# Patient Record
Sex: Female | Born: 1995 | Race: Black or African American | Hispanic: No | Marital: Single | Smoking: Never smoker
Health system: Southern US, Community
[De-identification: ages and names within clinical notes are randomized; demographics above are authoritative.]

## PROBLEM LIST (undated history)

## (undated) DIAGNOSIS — F419 Anxiety disorder, unspecified: Secondary | ICD-10-CM

## (undated) DIAGNOSIS — R569 Unspecified convulsions: Secondary | ICD-10-CM

## (undated) HISTORY — DX: Anxiety disorder, unspecified: F41.9

## (undated) HISTORY — DX: Unspecified convulsions: R56.9

---
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
## 2013-02-20 ENCOUNTER — Other Ambulatory Visit: Payer: Self-pay | Admitting: *Deleted

## 2013-02-20 MED ORDER — CLONAZEPAM 0.5 MG PO TABS: ORAL_TABLET | ORAL | Status: DC

## 2013-02-20 NOTE — Telephone Encounter (Signed)
[facility] 

## 2013-02-20 NOTE — Telephone Encounter (Signed)
Please route to your nurse first before signing and let know if approved or not so she can call in. Thanks

## 2013-02-20 NOTE — Telephone Encounter (Signed)
PLEASE TAKE CARE OF

## 2013-02-20 NOTE — Telephone Encounter (Signed)
Ok to call in

## 2013-03-26 ENCOUNTER — Other Ambulatory Visit: Payer: Self-pay | Admitting: Nurse Practitioner

## 2013-03-27 NOTE — Telephone Encounter (Signed)
Only been here once, 01/21/13 by ACM. Not on med list

## 2013-04-22 ENCOUNTER — Other Ambulatory Visit: Payer: Self-pay

## 2013-04-22 NOTE — Telephone Encounter (Signed)
rx denied Pt needs to be seen

## 2013-04-22 NOTE — Telephone Encounter (Signed)
Last filled 02/20/13  Last seen 01/21/13    If approved phone in and have nurse notify patient

## 2013-05-07 ENCOUNTER — Encounter: Payer: Self-pay | Admitting: Family Medicine

## 2013-05-07 ENCOUNTER — Ambulatory Visit (INDEPENDENT_AMBULATORY_CARE_PROVIDER_SITE_OTHER): Payer: Medicaid Other | Admitting: Family Medicine

## 2013-05-07 VITALS — BP 107/70 | HR 74 | Temp 97.7°F | Ht 61.25 in | Wt 221.0 lb

## 2013-05-07 DIAGNOSIS — G40909 Epilepsy, unspecified, not intractable, without status epilepticus: Secondary | ICD-10-CM

## 2013-05-07 DIAGNOSIS — R635 Abnormal weight gain: Secondary | ICD-10-CM

## 2013-05-07 DIAGNOSIS — E669 Obesity, unspecified: Secondary | ICD-10-CM

## 2013-05-07 DIAGNOSIS — F411 Generalized anxiety disorder: Secondary | ICD-10-CM

## 2013-05-07 MED ORDER — FLUOXETINE HCL 20 MG PO TABS: 20.0000 mg | ORAL_TABLET | Freq: Every day | ORAL | Status: DC

## 2013-05-07 MED ORDER — LEVETIRACETAM 250 MG PO TABS: ORAL_TABLET | ORAL | Status: DC

## 2013-05-07 MED ORDER — CLONAZEPAM 0.5 MG PO TABS: ORAL_TABLET | ORAL | Status: DC

## 2013-05-07 NOTE — Progress Notes (Signed)
HPI:  Patient presents today for general followup visit. Patient desire medication refills for her seizure disorder and anxiety.   Patient takes Keppra 750 mg at night and 250 mg during the day for seizure disorder. Patient states her last seizure was approximately July 2013. Has been otherwise asymptomatic. Was initially followed with [facility] neurology. However, has been getting her refills here at [facility].  Patient also is requesting a refill for her Klonopin. Patient was a baseline history of anxiety for the past 3-4 years. Patient states Klonopin nightly to help with sleep. Patient denies itch or depressive symptoms although is generally anxious about many things per patient. No homicidal or suicidal ideations. Patient denies any persistent insomnia, no irritability, no pressured speech. No known family history of bipolar disorder. Patient was prescribed Celexa for her anxiety. However, patient did not take this out of concern for seizures. Patient has not taken any SSRIs to help with anxiety in the past.  Patient also recently seen for abdominal pain and [facility]. Patient states she left AGAINST MEDICAL ADVICE when they recommended a CT scan. Abdominal pain is been fairly mild. Predominantly in the right lower quadrant. Intermittent in nature.  There are no active problems to display for this patient.  Past Medical History: Past Medical History  Diagnosis Date  . Seizures   . Anxiety     Past Surgical History: History reviewed. No pertinent past surgical history.  Social History: History   Social History  . Marital Status: Single    Spouse Name: N/A    Number of Children: N/A  . Years of Education: N/A   Social History Main Topics  . Smoking status: Never Smoker   . Smokeless tobacco: Never Used  . Alcohol Use: No  . Drug Use: No  . Sexually Active: None   Other Topics Concern  . None   Social History Narrative  . None     Family History: Family History  Problem Relation Age of Onset  . Hypertension Mother   . Liver disease Father   . Hepatitis C Father   . Stroke Sister 61  . Diabetes Maternal Grandmother   . Heart disease Maternal Grandmother     Allergies: No Known Allergies  Current Outpatient Prescriptions  Medication Sig Dispense Refill  . clonazePAM (KLONOPIN) 0.5 MG tablet Take one tablet by mouth once daily as needed  30 tablet  1  . fluticasone (FLONASE) 50 MCG/ACT nasal spray Place 2 sprays into the nose daily.      Marland Kitchen levETIRAcetam (KEPPRA) 250 MG tablet 3 tabs at night,  1 tab in am  125 tablet  6  . FLUoxetine (PROZAC) 20 MG tablet Take 1 tablet (20 mg total) by mouth daily.  30 tablet  3   No current facility-administered medications for this visit.   Review Of Systems: 12 point ROS negative except as noted above in HPI.   Physical Exam: Filed Vitals:   05/07/13 0948  BP: 107/70  Pulse: 74  Temp: 97.7 F (36.5 C)   General: obese, NAD  HEENT: PERRLA and extra ocular movement intact Heart: S1, S2 normal, no murmur, rub or gallop, regular rate and rhythm Lungs: clear to auscultation, no wheezes or rales and unlabored breathing Abdomen: Obese abdomen, soft, positive bowel sounds, minimal to mild abdominal pain diffusely  Extremities: extremities normal, atraumatic, no cyanosis or edema Skin:no rashes, no ecchymoses Neurology: normal without focal findings  Labs and Imaging: none   Assessment and Plan:  Obesity - Plan: Amb ref to Medical Nutrition Therapy-MNT  Anxiety state, unspecified - Plan: clonazePAM (KLONOPIN) 0.5 MG tablet, FLUoxetine (PROZAC) 20 MG tablet  Seizure disorder - Plan: levETIRAcetam (KEPPRA) 250 MG tablet  Start on Prozac for long-term management of anxiety. Fairly low seizure risk with his medication. Especially at low doses. When necessary, and for breakthrough anxiety. Continue Keppra Patient desires nutrition referral for obesity. This was  ordered. Followup in 36 weeks for reevaluation of mood with Prozac PHQ-9 score 13 today. Moderat disease.   In terms of abdominal pain, patient declined any workup of this today even though we discussed this. Discussed with patient that if her abdominal pain worsens she will need to be seen and imaging will need to be done.

## 2013-05-13 ENCOUNTER — Telehealth: Payer: Self-pay | Admitting: Family Medicine

## 2013-05-13 MED ORDER — LEVETIRACETAM 250 MG PO TABS: 250.0000 mg | ORAL_TABLET | ORAL | Status: DC

## 2013-05-13 MED ORDER — LEVETIRACETAM 750 MG PO TABS: 750.0000 mg | ORAL_TABLET | Freq: Every evening | ORAL | Status: DC

## 2013-05-13 NOTE — Telephone Encounter (Signed)
[facility] [place] [facility]'s request.    She now requests [place].  [facility] [place].  Patient is aware.

## 2013-07-03 ENCOUNTER — Other Ambulatory Visit: Payer: Self-pay | Admitting: Family Medicine

## 2013-07-06 ENCOUNTER — Other Ambulatory Visit: Payer: Self-pay | Admitting: Family Medicine

## 2013-07-08 ENCOUNTER — Other Ambulatory Visit: Payer: Self-pay | Admitting: Family Medicine

## 2013-07-08 DIAGNOSIS — F411 Generalized anxiety disorder: Secondary | ICD-10-CM

## 2013-07-08 MED ORDER — CLONAZEPAM 0.5 MG PO TABS: ORAL_TABLET | ORAL | Status: DC

## 2013-07-08 NOTE — Telephone Encounter (Signed)
Last seen 05/07/13  Dr Alvester Morin  If approved route to nurse to call in

## 2013-07-08 NOTE — Telephone Encounter (Signed)
Please phone clonazepam rx with 1 refill

## 2013-07-09 NOTE — Telephone Encounter (Signed)
CALLED IN 

## 2013-07-22 ENCOUNTER — Other Ambulatory Visit: Payer: Self-pay | Admitting: Family Medicine

## 2013-07-24 ENCOUNTER — Telehealth: Payer: Self-pay | Admitting: Family Medicine

## 2013-07-24 ENCOUNTER — Other Ambulatory Visit: Payer: Self-pay | Admitting: Nurse Practitioner

## 2013-07-24 ENCOUNTER — Other Ambulatory Visit: Payer: Self-pay | Admitting: Family Medicine

## 2013-07-24 DIAGNOSIS — R569 Unspecified convulsions: Secondary | ICD-10-CM

## 2013-07-24 MED ORDER — LEVETIRACETAM 250 MG PO TABS: 250.0000 mg | ORAL_TABLET | ORAL | Status: DC

## 2013-07-24 MED ORDER — LEVETIRACETAM 750 MG PO TABS: 750.0000 mg | ORAL_TABLET | Freq: Every evening | ORAL | Status: DC

## 2013-07-24 NOTE — Telephone Encounter (Signed)
sz meds sent to pharm

## 2013-09-06 ENCOUNTER — Other Ambulatory Visit: Payer: Self-pay | Admitting: Nurse Practitioner

## 2013-09-08 NOTE — Telephone Encounter (Signed)
Last seen by you on 05/07/13, last filled 07/08/13 with 1 RF, if approved route to pool A so [facility] [place] [phone]

## 2013-09-10 ENCOUNTER — Telehealth: Payer: Self-pay | Admitting: Family Medicine

## 2013-09-11 NOTE — Telephone Encounter (Signed)
Patient will schedule an appointment with Dr. Alvester Morin for the end of next week.  Schedule is not in the computer yet so I will call her back next week.

## 2013-09-17 ENCOUNTER — Encounter: Payer: Self-pay | Admitting: Family Medicine

## 2013-09-17 ENCOUNTER — Ambulatory Visit (INDEPENDENT_AMBULATORY_CARE_PROVIDER_SITE_OTHER): Payer: Medicaid Other | Admitting: Family Medicine

## 2013-09-17 ENCOUNTER — Encounter (INDEPENDENT_AMBULATORY_CARE_PROVIDER_SITE_OTHER): Payer: Self-pay

## 2013-09-17 VITALS — BP 116/72 | HR 71 | Temp 99.7°F | Wt 226.4 lb

## 2013-09-17 DIAGNOSIS — R109 Unspecified abdominal pain: Secondary | ICD-10-CM

## 2013-09-17 DIAGNOSIS — F411 Generalized anxiety disorder: Secondary | ICD-10-CM

## 2013-09-17 LAB — POCT URINALYSIS DIPSTICK
Bilirubin, UA: NEGATIVE
Glucose, UA: NEGATIVE
Nitrite, UA: NEGATIVE
Urobilinogen, UA: NEGATIVE

## 2013-09-17 MED ORDER — CLONAZEPAM 0.5 MG PO TABS: ORAL_TABLET | ORAL | Status: DC

## 2013-09-17 MED ORDER — FLUOXETINE HCL 20 MG PO TABS: 20.0000 mg | ORAL_TABLET | Freq: Every day | ORAL | Status: DC

## 2013-09-17 NOTE — Progress Notes (Signed)
  Subjective:    Patient ID: Kara Benson, female    DOB: 30-Aug-1996, 17 y.o.   MRN: [id number]  HPI Patient presents today for mood followup. Patient's was seen back in June for anxiety. Patient was placed on course of Prozac as well as Klonopin for breakthrough symptoms. Patient also with a baseline history of seizure disorder. On Keppra chronically. No seizure activity over many years. Per patient, she has been noncompliant with taking the Prozac. Patient is only taking the Klonopin for anxiety. Patient is concerned she may have become dependent on the medicine to the point she gets questionable withdrawals when not taking the medicine. Patient states her anxiety is very poorly controlled. She's had a difficult time sleeping as well she is overly anxious. Patient states she feels nervous taking the medicine. Has taken SSRIs in the past with no reported seizure activity. No homicidal or suicidal ideations.  Patient also reports right-sided abdominal discomfort. This is been a chronic issue for several months. Patient reports she's actually been to the ER for this and left AGAINST MEDICAL ADVICE when they were attempting a CT scan because she felt uncomfortable about doing the CT scan. No significant abdominal pain. No nausea vomiting. No dysuria. No vaginal discharge. Pain does not wake her up from sleep. Has more of a generalized intermittent abdominal discomfort. Abdominal discomfort does not seem to be related to food intake.   Review of Systems  All other systems reviewed and are negative.       Objective:   Physical Exam  Constitutional:  Obese  HENT:  Head: Normocephalic and atraumatic.  Eyes: Conjunctivae are normal. Pupils are equal, round, and reactive to light.  Neck: Normal range of motion.  Cardiovascular: Normal rate and regular rhythm.   Pulmonary/Chest: Effort normal.  Abdominal: Soft. Bowel sounds are normal.  No discernible abdominal pain on exam even with deep  palpation.  Musculoskeletal: Normal range of motion.  Neurological: She is alert.          Assessment & Plan:  Abdominal discomfort - Plan: US Abdomen Complete, POCT urinalysis dipstick, POCT urine pregnancy  Anxiety state, unspecified - Plan: FLUoxetine (PROZAC) 20 MG tablet, clonazePAM (KLONOPIN) 0.5 MG tablet, DISCONTINUED: clonazePAM (KLONOPIN) 0.5 MG tablet   Anxiety: Had a relatively lengthy discussion with both patient and mom about the importance of SSRI use for chronic anxiety. Discussed with patient and mom that while there is some risk of seizure activity with SSRIs and is relatively low especially low dose SSRI treatment. Discussed with patient and mom that at this point if she feels uncomfortable taking an SSRI but I do not feel comfortable prescribing her any anxiolytic medications and she will likely need a psychiatry referral. I would like to see the patient back in 2 weeks for reevaluation of her mood. Patient states that she is agreeable to this.  Abdominal discomfort: There is no discernible abdominal discomfort on exam. We'll check a urinalysis to rule out UTI. Will send patient for abdominal ultrasound to eval for any hepatobiliary disease. Discussed using a laxative if necessary. Also discussed with patient the importance of compliance with treatments and testing for medical issues, otherwise patient will not be able to fully have her issues evaluated.

## 2013-09-18 ENCOUNTER — Other Ambulatory Visit: Payer: Self-pay | Admitting: Family Medicine

## 2013-09-22 ENCOUNTER — Other Ambulatory Visit (INDEPENDENT_AMBULATORY_CARE_PROVIDER_SITE_OTHER): Payer: Medicaid Other

## 2013-09-22 ENCOUNTER — Other Ambulatory Visit: Payer: Self-pay | Admitting: *Deleted

## 2013-09-22 DIAGNOSIS — N390 Urinary tract infection, site not specified: Secondary | ICD-10-CM

## 2013-09-22 MED ORDER — CEPHALEXIN 500 MG PO CAPS: 500.0000 mg | ORAL_CAPSULE | Freq: Two times a day (BID) | ORAL | Status: DC

## 2013-09-24 LAB — URINE CULTURE

## 2013-09-28 ENCOUNTER — Telehealth: Payer: Self-pay | Admitting: Family Medicine

## 2013-09-28 NOTE — Telephone Encounter (Signed)
NO RESULTS YET. PT SAID SHE WAS GOING TO CALL FACILITY THAT DID U/S AND ASK THEM TO SEND TO [place].

## 2013-09-30 ENCOUNTER — Ambulatory Visit: Payer: Medicaid Other | Admitting: Family Medicine

## 2013-09-30 NOTE — Telephone Encounter (Signed)
I CALLED [facility] AND HAD U/S RESULTS FAXED TO DR. Alvester Morin. DR NEWTON REVIEWED U/S AND RESULTS WERE NEGATIVE. PT NOTIFIED AND VERBALIZED UNDERSTANDING.

## 2013-10-06 ENCOUNTER — Ambulatory Visit (INDEPENDENT_AMBULATORY_CARE_PROVIDER_SITE_OTHER): Payer: Medicaid Other | Admitting: Family Medicine

## 2013-10-06 VITALS — BP 121/76 | HR 90 | Temp 99.9°F | Ht 62.13 in | Wt 226.0 lb

## 2013-10-06 DIAGNOSIS — F411 Generalized anxiety disorder: Secondary | ICD-10-CM

## 2013-10-06 NOTE — Progress Notes (Signed)
  Subjective:    Patient ID: Kara Benson, female    DOB: 1996-02-13, 17 y.o.   MRN: [id number]  HPI  Pt presents today for mood follow up  Pt recently started on prozac for anxiety  Has been compliant with this medication over the past 2 weeks Feels like her anxiety and sleep are much improved with medication  No seizure activity in setting of baseline hx/o epilepsy.  Has had some HA with medication, but feels like this may be improving.     Review of Systems  All other systems reviewed and are negative.       Objective:   Physical Exam  Constitutional:  Obese   HENT:  Head: Normocephalic and atraumatic.  Eyes: Conjunctivae are normal. Pupils are equal, round, and reactive to light.  Neck: Normal range of motion.  Cardiovascular: Normal rate, regular rhythm and normal heart sounds.   Pulmonary/Chest: Effort normal and breath sounds normal.  Abdominal: Soft.  Musculoskeletal: Normal range of motion.  Neurological: She is alert.  Skin: Skin is warm.          Assessment & Plan:  Anxiety state, unspecified  Clinically improved with low dose prozac  Continue with this dose.  Consider change in medication within drug class if HA  Persists vs. Psych referral  Plan for follow up in 2 weeks  Discussed general and neuropsych red flags.  Follow up as needed.

## 2013-10-12 ENCOUNTER — Ambulatory Visit (INDEPENDENT_AMBULATORY_CARE_PROVIDER_SITE_OTHER): Payer: Medicaid Other | Admitting: General Practice

## 2013-10-12 ENCOUNTER — Encounter: Payer: Self-pay | Admitting: General Practice

## 2013-10-12 VITALS — BP 104/61 | HR 100 | Temp 99.7°F | Ht 62.0 in | Wt 223.0 lb

## 2013-10-12 DIAGNOSIS — J302 Other seasonal allergic rhinitis: Secondary | ICD-10-CM

## 2013-10-12 DIAGNOSIS — J329 Chronic sinusitis, unspecified: Secondary | ICD-10-CM

## 2013-10-12 DIAGNOSIS — J309 Allergic rhinitis, unspecified: Secondary | ICD-10-CM

## 2013-10-12 MED ORDER — FLUTICASONE PROPIONATE 50 MCG/ACT NA SUSP: 2.0000 | Freq: Every day | NASAL | Status: DC

## 2013-10-12 MED ORDER — AZITHROMYCIN 250 MG PO TABS: ORAL_TABLET | ORAL | Status: DC

## 2013-10-12 NOTE — Progress Notes (Signed)
  Subjective:    Patient ID: Kara Benson, female    DOB: 19-Jan-1996, 19 y.o.   MRN: [id number]  HPI Patient presents today with complaints of nasal congestion and sore throat. Reports onset 2 days ago and congestion is gradually worsening. Denies OTC medications. Reports first day of last menstrual cycle was 4 days ago.     Review of Systems  Constitutional: Negative for fever and chills.  HENT: Positive for congestion, postnasal drip, sinus pressure and sore throat.   Respiratory: Negative for chest tightness and shortness of breath.   Cardiovascular: Negative for chest pain and palpitations.  Neurological: Negative for dizziness, weakness and headaches.       Objective:   Physical Exam  Constitutional: She is oriented to person, place, and time. She appears well-developed and well-nourished.  HENT:  Head: Normocephalic and atraumatic.  Right Ear: External ear normal.  Left Ear: External ear normal.  Nose: Right sinus exhibits maxillary sinus tenderness and frontal sinus tenderness. Left sinus exhibits maxillary sinus tenderness and frontal sinus tenderness.  Mouth/Throat: Posterior oropharyngeal erythema present.  Eyes: EOM are normal. Pupils are equal, round, and reactive to light.  Neck: Normal range of motion. Neck supple.  Cardiovascular: Normal rate, regular rhythm and normal heart sounds.   No murmur heard. Pulmonary/Chest: Effort normal and breath sounds normal. No respiratory distress. She exhibits no tenderness.  Neurological: She is alert and oriented to person, place, and time.  Skin: Skin is warm and dry. No rash noted.  Psychiatric: She has a normal mood and affect.          Assessment & Plan:  1. Sinusitis  - azithromycin (ZITHROMAX) 250 MG tablet; Take as directed  Dispense: 6 tablet; Refill: 0  2. Seasonal allergic rhinitis  - fluticasone (FLONASE) 50 MCG/ACT nasal spray; Place 2 sprays into both nostrils daily.  Dispense: 16 g; Refill: 2 -increase  fluids -RTO if symptoms worsen or unresolved -Patient verbalized understanding Coralie Keens, FNP-C

## 2013-10-12 NOTE — Patient Instructions (Signed)

## 2013-10-29 ENCOUNTER — Other Ambulatory Visit: Payer: Self-pay | Admitting: *Deleted

## 2013-10-29 DIAGNOSIS — J329 Chronic sinusitis, unspecified: Secondary | ICD-10-CM

## 2013-10-29 NOTE — Telephone Encounter (Signed)
Last ov- 10/12/13

## 2013-10-30 MED ORDER — AZITHROMYCIN 250 MG PO TABS: ORAL_TABLET | ORAL | Status: DC

## 2013-11-13 ENCOUNTER — Other Ambulatory Visit: Payer: Self-pay | Admitting: Family Medicine

## 2013-11-16 NOTE — Telephone Encounter (Signed)
LAST SEEN 10/12/13  mAE

## 2013-11-17 ENCOUNTER — Telehealth: Payer: Self-pay | Admitting: Family Medicine

## 2013-11-23 ENCOUNTER — Other Ambulatory Visit: Payer: Self-pay | Admitting: Family Medicine

## 2013-11-23 MED ORDER — LEVETIRACETAM 750 MG PO TABS: 750.0000 mg | ORAL_TABLET | Freq: Every morning | ORAL | Status: DC

## 2013-11-23 MED ORDER — LEVETIRACETAM 250 MG PO TABS: 250.0000 mg | ORAL_TABLET | Freq: Every morning | ORAL | Status: DC

## 2013-11-23 NOTE — Telephone Encounter (Signed)
meds called in to [facility] [place]

## 2013-12-04 ENCOUNTER — Ambulatory Visit (INDEPENDENT_AMBULATORY_CARE_PROVIDER_SITE_OTHER): Payer: Medicaid Other | Admitting: Family Medicine

## 2013-12-04 ENCOUNTER — Encounter: Payer: Self-pay | Admitting: Family Medicine

## 2013-12-04 VITALS — BP 115/84 | HR 104 | Temp 98.3°F | Ht 61.5 in | Wt 223.0 lb

## 2013-12-04 DIAGNOSIS — J329 Chronic sinusitis, unspecified: Secondary | ICD-10-CM

## 2013-12-04 DIAGNOSIS — J069 Acute upper respiratory infection, unspecified: Secondary | ICD-10-CM

## 2013-12-04 MED ORDER — AZITHROMYCIN 250 MG PO TABS: ORAL_TABLET | ORAL | Status: DC

## 2013-12-04 NOTE — Patient Instructions (Signed)

## 2013-12-04 NOTE — Progress Notes (Signed)
   Subjective:    Patient ID: Kara Benson, female    DOB: 11/16/1996, 18 y.o.   MRN: [id number]  HPI This 18 y.o. female presents for evaluation of sinus drainage, left frontal headache, And swollen lymph node left neck.   Review of Systems No chest pain, SOB, HA, dizziness, vision change, N/V, diarrhea, constipation, dysuria, urinary urgency or frequency, myalgias, arthralgias or rash.     Objective:   Physical Exam  Vital signs noted  Well developed well nourished female.  HEENT - Head atraumatic Normocephalic                Eyes - PERRLA, Conjuctiva - clear Sclera- Clear EOMI                Ears - EAC's Wnl TM's Wnl Gross Hearing WNL                Throat - oropharanx wnl                Neck - swollen left submandibular lymph node Respiratory - Lungs CTA bilateral Cardiac - RRR S1 and S2 without murmur GI - Abdomen soft Nontender and bowel sounds active x4      Assessment & Plan:   URI (upper respiratory infection) - Plan: azithromycin (ZITHROMAX) 250 MG tablet Push po fluids, rest, tylenol and motrin otc prn as directed for fever, arthralgias, and myalgias.  Follow up prn if sx's continue or persist.  Deatra CanterWilliam J [place] FNP

## 2013-12-19 ENCOUNTER — Other Ambulatory Visit: Payer: Self-pay | Admitting: Family Medicine

## 2013-12-22 ENCOUNTER — Telehealth: Payer: Self-pay | Admitting: Family Medicine

## 2014-01-12 ENCOUNTER — Encounter: Payer: Self-pay | Admitting: Family Medicine

## 2014-01-12 ENCOUNTER — Ambulatory Visit (INDEPENDENT_AMBULATORY_CARE_PROVIDER_SITE_OTHER): Payer: Medicaid Other | Admitting: Family Medicine

## 2014-01-12 VITALS — BP 100/68 | HR 91 | Temp 98.7°F | Ht 61.5 in | Wt 215.0 lb

## 2014-01-12 DIAGNOSIS — R300 Dysuria: Secondary | ICD-10-CM

## 2014-01-12 DIAGNOSIS — G40909 Epilepsy, unspecified, not intractable, without status epilepticus: Secondary | ICD-10-CM

## 2014-01-12 LAB — GLUCOSE, POCT (MANUAL RESULT ENTRY): POC Glucose: 80 mg/dl (ref 70–99)

## 2014-01-12 MED ORDER — LEVETIRACETAM 250 MG PO TABS: 250.0000 mg | ORAL_TABLET | Freq: Every morning | ORAL | Status: DC

## 2014-01-12 MED ORDER — LEVETIRACETAM 750 MG PO TABS: 750.0000 mg | ORAL_TABLET | Freq: Every day | ORAL | Status: DC

## 2014-01-12 NOTE — Progress Notes (Signed)
Subjective:    Patient ID: Kara Benson, female    DOB: 1996/11/04, 18 y.o.   MRN: [id number]  HPI Patient here today for dysuria and refill on medications. This has been going on off and on for about a week. She also needs her Keppra refill. I am not sure when she had her last Keppra level done.      There are no active problems to display for this patient.  Outpatient Encounter Prescriptions as of 01/12/2014  Medication Sig  . fluticasone (FLONASE) 50 MCG/ACT nasal spray Place 2 sprays into both nostrils daily.  Marland Kitchen. levETIRAcetam (KEPPRA) 250 MG tablet Take 1 tablet (250 mg total) by mouth every morning.  . levETIRAcetam (KEPPRA) 750 MG tablet Take 750 mg by mouth at bedtime.  . [DISCONTINUED] levETIRAcetam (KEPPRA) 750 MG tablet Take 1 tablet (750 mg total) by mouth every morning.  . [DISCONTINUED] azithromycin (ZITHROMAX) 250 MG tablet Take as directed  . [DISCONTINUED] FLUoxetine (PROZAC) 20 MG tablet Take 1 tablet (20 mg total) by mouth daily.    Review of Systems  Constitutional: Negative.   HENT: Negative.   Eyes: Negative.   Respiratory: Negative.   Cardiovascular: Negative.   Gastrointestinal: Negative.   Endocrine: Negative.   Genitourinary: Positive for dysuria. Negative for urgency, frequency, flank pain, vaginal discharge and difficulty urinating.  Musculoskeletal: Negative.   Skin: Negative.   Allergic/Immunologic: Negative.   Neurological: Negative.   Hematological: Negative.   Psychiatric/Behavioral: Negative.            Objective:   Physical Exam  Nursing note and vitals reviewed. Constitutional: She is oriented to person, place, and time. She appears well-developed and well-nourished. No distress.  HENT:  Head: Normocephalic and atraumatic.  Eyes: Conjunctivae and EOM are normal. Pupils are equal, round, and reactive to light. Right eye exhibits no discharge. Left eye exhibits no discharge. No scleral icterus.  Neck: Normal range of motion.    Abdominal: Soft. Bowel sounds are normal. She exhibits no mass. There is no tenderness. There is no rebound and no guarding.  Musculoskeletal: Normal range of motion.  Neurological: She is alert and oriented to person, place, and time.  Skin: Skin is warm and dry. No rash noted.  Psychiatric: She has a normal mood and affect. Her behavior is normal. Judgment and thought content normal.   BP 100/68  Pulse 91  Temp(Src) 98.7 F (37.1 C) (Oral)  Ht 5' 1.5" (1.562 m)  Wt 215 lb (97.523 kg)  BMI 39.97 kg/m2  LMP 01/05/2014         Assessment & Plan:  1. Dysuria - POCT UA - Microscopic Only - POCT urinalysis dipstick - POCT glucose (manual entry)  2. Seizure disorder - Levetiracetam level  Meds ordered this encounter  Medications  . DISCONTD: levETIRAcetam (KEPPRA) 750 MG tablet    Sig: Take 750 mg by mouth at bedtime.  . levETIRAcetam (KEPPRA) 750 MG tablet    Sig: Take 1 tablet (750 mg total) by mouth at bedtime.    Dispense:  30 tablet    Refill:  2  . levETIRAcetam (KEPPRA) 250 MG tablet    Sig: Take 1 tablet (250 mg total) by mouth every morning.    Dispense:  30 tablet    Refill:  2   Patient Instructions  Please use scent free detergents, fabric softeners, and soaps Continue to work on losing weight and watching your intake of sugar Try to drink plenty of water Showers are preferred  over tub baths Always rinse yourself off thoroughly The Keppra has been refilled for  three-month   Please return urine for urinalysis and urine culture and sensitivity  Nyra Capes MD

## 2014-01-12 NOTE — Patient Instructions (Addendum)
Please use scent free detergents, fabric softeners, and soaps Continue to work on losing weight and watching your intake of sugar Try to drink plenty of water Showers are preferred over tub baths Always rinse yourself off thoroughly The Keppra has been refilled for  three-month

## 2014-01-13 LAB — LEVETIRACETAM LEVEL: Levetiracetam Lvl: 11.3 ug/mL (ref 5.0–63.0)

## 2014-01-21 ENCOUNTER — Ambulatory Visit (INDEPENDENT_AMBULATORY_CARE_PROVIDER_SITE_OTHER): Payer: Medicaid Other | Admitting: Nurse Practitioner

## 2014-01-21 ENCOUNTER — Encounter: Payer: Self-pay | Admitting: Nurse Practitioner

## 2014-01-21 VITALS — BP 122/79 | HR 99 | Temp 99.2°F | Wt 212.0 lb

## 2014-01-21 DIAGNOSIS — R300 Dysuria: Secondary | ICD-10-CM

## 2014-01-21 DIAGNOSIS — N390 Urinary tract infection, site not specified: Secondary | ICD-10-CM

## 2014-01-21 DIAGNOSIS — L989 Disorder of the skin and subcutaneous tissue, unspecified: Secondary | ICD-10-CM

## 2014-01-21 LAB — POCT URINALYSIS DIPSTICK
Bilirubin, UA: NEGATIVE
GLUCOSE UA: NEGATIVE
Ketones, UA: NEGATIVE
Nitrite, UA: NEGATIVE
Protein, UA: NEGATIVE
RBC UA: NEGATIVE
SPEC GRAV UA: 1.01
Urobilinogen, UA: NEGATIVE
pH, UA: 7

## 2014-01-21 LAB — POCT UA - MICROSCOPIC ONLY
BACTERIA, U MICROSCOPIC: NEGATIVE
CRYSTALS, UR, HPF, POC: NEGATIVE
Casts, Ur, LPF, POC: NEGATIVE
Mucus, UA: NEGATIVE
RBC, URINE, MICROSCOPIC: NEGATIVE
Yeast, UA: NEGATIVE

## 2014-01-21 MED ORDER — NITROFURANTOIN MONOHYD MACRO 100 MG PO CAPS: 100.0000 mg | ORAL_CAPSULE | Freq: Two times a day (BID) | ORAL | Status: DC

## 2014-01-21 MED ORDER — FLUCONAZOLE 150 MG PO TABS: 150.0000 mg | ORAL_TABLET | Freq: Once | ORAL | Status: DC

## 2014-01-21 NOTE — Progress Notes (Signed)
   Subjective:    Patient ID: Kara Benson, female    DOB: 06-Oct-1996, 18 y.o.   MRN: [id number]  HPI Patient in c/o perineal discomfort- no itching, no burning and no odor. Slight discharge. STrated 2 weeks ago. Does not practice safe sex.    Review of Systems  Genitourinary: Positive for vaginal discharge. Negative for dysuria, urgency, frequency, enuresis and difficulty urinating.  All other systems reviewed and are negative.       Objective:   Physical Exam  Constitutional: She is oriented to person, place, and time. She appears well-developed and well-nourished.  Cardiovascular: Normal rate, regular rhythm and normal heart sounds.   Pulmonary/Chest: Effort normal and breath sounds normal.  Neurological: She is alert and oriented to person, place, and time.  Skin: Skin is warm and dry.  Psychiatric: She has a normal mood and affect. Her behavior is normal. Judgment and thought content normal.   BP 122/79  Pulse 99  Temp(Src) 99.2 F (37.3 C) (Oral)  Wt 212 lb (96.163 kg)  LMP 01/05/2014 Results for orders placed in visit on 01/21/14  POCT URINALYSIS DIPSTICK      Result Value Ref Range   Color, UA yellow     Clarity, UA cloudy     Glucose, UA neg     Bilirubin, UA neg     Ketones, UA neg     Spec Grav, UA 1.010     Blood, UA neg     pH, UA 7.0     Protein, UA neg     Urobilinogen, UA negative     Nitrite, UA neg     Leukocytes, UA Trace    POCT UA - MICROSCOPIC ONLY      Result Value Ref Range   WBC, Ur, HPF, POC 15-20     RBC, urine, microscopic NEG     Bacteria, U Microscopic NEG     Mucus, UA NEG     Epithelial cells, urine per micros OCC     Crystals, Ur, HPF, POC NEG     Casts, Ur, LPF, POC NEG     Yeast, UA NEG            Assessment & Plan:   1. Dysuria   2. Perineal irritation in female   3. UTI (urinary tract infection)    Meds ordered this encounter  Medications  . nitrofurantoin, macrocrystal-monohydrate, (MACROBID) 100 MG capsule   Sig: Take 1 capsule (100 mg total) by mouth 2 (two) times daily.    Dispense:  14 capsule    Refill:  0    Order Specific Question:  Supervising Provider    Answer:  Ernestina PennaMOORE, DONALD W [[id number]]  . fluconazole (DIFLUCAN) 150 MG tablet    Sig: Take 1 tablet (150 mg total) by mouth once.    Dispense:  1 tablet    Refill:  0    Order Specific Question:  Supervising Provider    Answer:  Ernestina PennaMOORE, DONALD W [[id number]]   Orders Placed This Encounter  Procedures  . GC/Chlamydia Probe Amp  . POCT urinalysis dipstick  . POCT UA - Microscopic Only    Force fluids AZO over the counter X2 days RTO prn Culture pending Practice safe sex RTO prn  Mary-Margaret Daphine DeutscherMartin, FNP

## 2014-01-21 NOTE — Patient Instructions (Signed)
Urinary Tract Infection  Urinary tract infections (UTIs) can develop anywhere along your urinary tract. Your urinary tract is your body's drainage system for removing wastes and extra water. Your urinary tract includes two kidneys, two ureters, a bladder, and a urethra. Your kidneys are a pair of bean-shaped organs. Each kidney is about the size of your fist. They are located below your ribs, one on each side of your spine.  CAUSES  Infections are caused by microbes, which are microscopic organisms, including fungi, viruses, and bacteria. These organisms are so small that they can only be seen through a microscope. Bacteria are the microbes that most commonly cause UTIs.  SYMPTOMS   Symptoms of UTIs may vary by age and gender of the patient and by the location of the infection. Symptoms in young women typically include a frequent and intense urge to urinate and a painful, burning feeling in the bladder or urethra during urination. Older women and men are more likely to be tired, shaky, and weak and have muscle aches and abdominal pain. A fever may mean the infection is in your kidneys. Other symptoms of a kidney infection include pain in your back or sides below the ribs, nausea, and vomiting.  DIAGNOSIS  To diagnose a UTI, your caregiver will ask you about your symptoms. Your caregiver also will ask to provide a urine sample. The urine sample will be tested for bacteria and white blood cells. White blood cells are made by your body to help fight infection.  TREATMENT   Typically, UTIs can be treated with medication. Because most UTIs are caused by a bacterial infection, they usually can be treated with the use of antibiotics. The choice of antibiotic and length of treatment depend on your symptoms and the type of bacteria causing your infection.  HOME CARE INSTRUCTIONS   If you were prescribed antibiotics, take them exactly as your caregiver instructs you. Finish the medication even if you feel better after you  have only taken some of the medication.   Drink enough water and fluids to keep your urine clear or pale yellow.   Avoid caffeine, tea, and carbonated beverages. They tend to irritate your bladder.   Empty your bladder often. Avoid holding urine for long periods of time.   Empty your bladder before and after sexual intercourse.   After a bowel movement, women should cleanse from front to back. Use each tissue only once.  SEEK MEDICAL CARE IF:    You have back pain.   You develop a fever.   Your symptoms do not begin to resolve within 3 days.  SEEK IMMEDIATE MEDICAL CARE IF:    You have severe back pain or lower abdominal pain.   You develop chills.   You have nausea or vomiting.   You have continued burning or discomfort with urination.  MAKE SURE YOU:    Understand these instructions.   Will watch your condition.   Will get help right away if you are not doing well or get worse.  Document Released: 08/15/2005 Document Revised: 05/06/2012 Document Reviewed: 12/14/2011  Exit[employer] Patient Information 2014 [employer].

## 2014-01-25 ENCOUNTER — Telehealth: Payer: Self-pay | Admitting: Nurse Practitioner

## 2014-01-25 NOTE — Telephone Encounter (Signed)
Pt notified of message below.

## 2014-01-25 NOTE — Telephone Encounter (Signed)
Please advise 

## 2014-01-25 NOTE — Telephone Encounter (Signed)
Really needs to complete meds to completely get rid of infection

## 2014-01-26 LAB — GC/CHLAMYDIA PROBE AMP
Chlamydia trachomatis, NAA: NEGATIVE
Neisseria gonorrhoeae by PCR: NEGATIVE

## 2014-01-28 ENCOUNTER — Telehealth: Payer: Self-pay | Admitting: *Deleted

## 2014-01-28 NOTE — Telephone Encounter (Signed)
Please call for results

## 2014-01-28 NOTE — Telephone Encounter (Signed)
Message copied by Almeta MonasSTONE, Chauntel Windsor M on Thu Jan 28, 2014  2:11 PM ------      Message from: Bennie PieriniMARTIN, MARY-MARGARET      Created: Thu Jan 28, 2014 11:56 AM       Urine results discussed at appointment- and treated with macrobid      G/C negative ------

## 2014-01-28 NOTE — Telephone Encounter (Signed)
Pt aware of lab results.  rs °

## 2014-02-10 ENCOUNTER — Telehealth: Payer: Self-pay | Admitting: Nurse Practitioner

## 2014-02-10 NOTE — Telephone Encounter (Signed)
appt made

## 2014-02-11 ENCOUNTER — Encounter: Payer: Self-pay | Admitting: *Deleted

## 2014-02-11 ENCOUNTER — Ambulatory Visit (INDEPENDENT_AMBULATORY_CARE_PROVIDER_SITE_OTHER): Payer: Medicaid Other | Admitting: Nurse Practitioner

## 2014-02-11 ENCOUNTER — Encounter: Payer: Self-pay | Admitting: Nurse Practitioner

## 2014-02-11 VITALS — BP 120/77 | HR 95 | Temp 96.9°F | Resp 16 | Ht 61.5 in | Wt 217.0 lb

## 2014-02-11 DIAGNOSIS — R350 Frequency of micturition: Secondary | ICD-10-CM

## 2014-02-11 DIAGNOSIS — IMO0001 Reserved for inherently not codable concepts without codable children: Secondary | ICD-10-CM

## 2014-02-11 DIAGNOSIS — R300 Dysuria: Secondary | ICD-10-CM

## 2014-02-11 MED ORDER — CIPROFLOXACIN HCL 500 MG PO TABS: 500.0000 mg | ORAL_TABLET | Freq: Two times a day (BID) | ORAL | Status: DC

## 2014-02-11 MED ORDER — FLUCONAZOLE 150 MG PO TABS: 150.0000 mg | ORAL_TABLET | Freq: Once | ORAL | Status: DC

## 2014-02-11 NOTE — Patient Instructions (Signed)
Urinary Tract Infection  Urinary tract infections (UTIs) can develop anywhere along your urinary tract. Your urinary tract is your body's drainage system for removing wastes and extra water. Your urinary tract includes two kidneys, two ureters, a bladder, and a urethra. Your kidneys are a pair of bean-shaped organs. Each kidney is about the size of your fist. They are located below your ribs, one on each side of your spine.  CAUSES  Infections are caused by microbes, which are microscopic organisms, including fungi, viruses, and bacteria. These organisms are so small that they can only be seen through a microscope. Bacteria are the microbes that most commonly cause UTIs.  SYMPTOMS   Symptoms of UTIs may vary by age and gender of the patient and by the location of the infection. Symptoms in young women typically include a frequent and intense urge to urinate and a painful, burning feeling in the bladder or urethra during urination. Older women and men are more likely to be tired, shaky, and weak and have muscle aches and abdominal pain. A fever may mean the infection is in your kidneys. Other symptoms of a kidney infection include pain in your back or sides below the ribs, nausea, and vomiting.  DIAGNOSIS  To diagnose a UTI, your caregiver will ask you about your symptoms. Your caregiver also will ask to provide a urine sample. The urine sample will be tested for bacteria and white blood cells. White blood cells are made by your body to help fight infection.  TREATMENT   Typically, UTIs can be treated with medication. Because most UTIs are caused by a bacterial infection, they usually can be treated with the use of antibiotics. The choice of antibiotic and length of treatment depend on your symptoms and the type of bacteria causing your infection.  HOME CARE INSTRUCTIONS   If you were prescribed antibiotics, take them exactly as your caregiver instructs you. Finish the medication even if you feel better after you  have only taken some of the medication.   Drink enough water and fluids to keep your urine clear or pale yellow.   Avoid caffeine, tea, and carbonated beverages. They tend to irritate your bladder.   Empty your bladder often. Avoid holding urine for long periods of time.   Empty your bladder before and after sexual intercourse.   After a bowel movement, women should cleanse from front to back. Use each tissue only once.  SEEK MEDICAL CARE IF:    You have back pain.   You develop a fever.   Your symptoms do not begin to resolve within 3 days.  SEEK IMMEDIATE MEDICAL CARE IF:    You have severe back pain or lower abdominal pain.   You develop chills.   You have nausea or vomiting.   You have continued burning or discomfort with urination.  MAKE SURE YOU:    Understand these instructions.   Will watch your condition.   Will get help right away if you are not doing well or get worse.  Document Released: 08/15/2005 Document Revised: 05/06/2012 Document Reviewed: 12/14/2011  Exit[employer] Patient Information 2014 [employer].

## 2014-02-11 NOTE — Addendum Note (Signed)
Addended by: Prescott GumLAND, Arvetta Araque M on: 02/11/2014 02:32 PM   Modules accepted: Orders

## 2014-02-11 NOTE — Progress Notes (Signed)
   Subjective:    Patient ID: Kara Benson, female    DOB: [birth year], 18 y.o.   MRN: [id number]  HPI Patient was seen 01/21/14 with UTI - was given macrobid and diflucan- she took th ediflucan but the macrobid made her sick soo she only took for 2 1/2 days- Now symptoms have come back.    Review of Systems  Constitutional: Negative.   HENT: Negative.   Respiratory: Negative.   Cardiovascular: Negative.   Genitourinary: Positive for urgency, frequency and enuresis.  Neurological: Negative.   Hematological: Negative.   All other systems reviewed and are negative.       Objective:   Physical Exam  Constitutional: She appears well-developed and well-nourished.  Cardiovascular: Normal rate, regular rhythm and normal heart sounds.   Pulmonary/Chest: Effort normal and breath sounds normal.  Abdominal: Soft. Bowel sounds are normal. There is tenderness (mild supra pubic pain).  Genitourinary:  No CVA tnederness   BP 120/77  Pulse 95  Temp(Src) 96.9 F (36.1 C) (Oral)  Resp 16  Ht 5' 1.5" (1.562 m)  Wt 217 lb (98.431 kg)  BMI 40.34 kg/m2  LMP 01/31/2014   * PAtient unable to void     Assessment & Plan:   1. Frequency   2. Dysuria    Meds ordered this encounter  Medications  . ciprofloxacin (CIPRO) 500 MG tablet    Sig: Take 1 tablet (500 mg total) by mouth 2 (two) times daily.    Dispense:  6 tablet    Refill:  0    Order Specific Question:  Supervising Provider    Answer:  Ernestina PennaMOORE, DONALD W [[id number]]  . fluconazole (DIFLUCAN) 150 MG tablet    Sig: Take 1 tablet (150 mg total) by mouth once.    Dispense:  1 tablet    Refill:  0    Order Specific Question:  Supervising Provider    Answer:  Ernestina PennaMOORE, DONALD W [[id number]]   Force fluids AZO over the counter X2 days RTO prn Culture pending  Mary-Margaret Daphine DeutscherMartin, FNP

## 2014-04-19 ENCOUNTER — Other Ambulatory Visit: Payer: Self-pay | Admitting: Family Medicine

## 2014-04-21 ENCOUNTER — Telehealth: Payer: Self-pay | Admitting: Family Medicine

## 2014-04-21 NOTE — Telephone Encounter (Signed)
appt given for mmm 6/4

## 2014-04-22 ENCOUNTER — Ambulatory Visit (INDEPENDENT_AMBULATORY_CARE_PROVIDER_SITE_OTHER): Payer: Medicaid Other | Admitting: Nurse Practitioner

## 2014-04-22 ENCOUNTER — Encounter: Payer: Self-pay | Admitting: Nurse Practitioner

## 2014-04-22 VITALS — BP 130/89 | HR 93 | Temp 98.9°F | Ht 61.5 in | Wt 211.8 lb

## 2014-04-22 DIAGNOSIS — B369 Superficial mycosis, unspecified: Secondary | ICD-10-CM

## 2014-04-22 MED ORDER — CLOTRIMAZOLE-BETAMETHASONE 1-0.05 % EX CREA: 1.0000 "application " | TOPICAL_CREAM | Freq: Two times a day (BID) | CUTANEOUS | Status: DC

## 2014-04-22 NOTE — Progress Notes (Signed)
   Subjective:    Patient ID: Kara Benson, female    DOB: 1996/03/18, 18 y.o.   MRN: [id number]  HPI Patient in today c/o discoloration on left tigh- noticed about 3 weeks ago- has not changed except it may be a little lighter. Denies itching.    Review of Systems  Constitutional: Negative.   Respiratory: Negative.   Cardiovascular: Negative.   Neurological: Negative.   Psychiatric/Behavioral: Negative.   All other systems reviewed and are negative.      Objective:   Physical Exam  Constitutional: She is oriented to person, place, and time. She appears well-developed and well-nourished.  Cardiovascular: Normal rate, regular rhythm and normal heart sounds.   Pulmonary/Chest: Effort normal and breath sounds normal.  Neurological: She is alert and oriented to person, place, and time.  Skin: Skin is warm and dry.  8cm irregular shaped macular lesion left ant thigh  Psychiatric: She has a normal mood and affect. Her behavior is normal. Judgment and thought content normal.   BP 130/89  Pulse 93  Temp(Src) 98.9 F (37.2 C) (Oral)  Ht 5' 1.5" (1.562 m)  Wt 211 lb 12.8 oz (96.072 kg)  BMI 39.38 kg/m2  LMP 04/22/2014        Assessment & Plan:  1. Fungal dermatitis Do not rub or scratch RTO if not resolving - clotrimazole-betamethasone (LOTRISONE) cream; Apply 1 application topically 2 (two) times daily.  Dispense: 45 g; Refill: 0  Mary-Margaret Daphine Deutscher, FNP

## 2014-05-06 ENCOUNTER — Other Ambulatory Visit: Payer: Self-pay | Admitting: Family Medicine

## 2014-05-24 ENCOUNTER — Other Ambulatory Visit: Payer: Self-pay | Admitting: Family Medicine

## 2014-05-26 ENCOUNTER — Other Ambulatory Visit: Payer: Self-pay | Admitting: Family Medicine

## 2014-05-27 ENCOUNTER — Other Ambulatory Visit: Payer: Self-pay

## 2014-05-27 MED ORDER — LEVETIRACETAM 750 MG PO TABS: ORAL_TABLET | ORAL | Status: DC

## 2014-05-27 NOTE — Telephone Encounter (Signed)
Last seen 05/02/14  MMM

## 2014-06-23 ENCOUNTER — Other Ambulatory Visit: Payer: Self-pay | Admitting: Nurse Practitioner

## 2014-06-25 ENCOUNTER — Other Ambulatory Visit: Payer: Self-pay | Admitting: Nurse Practitioner

## 2014-06-25 NOTE — Telephone Encounter (Signed)
Last seen 04/22/14  MMM 

## 2014-06-28 NOTE — Telephone Encounter (Signed)
Last seen 04/22/14  MMM

## 2014-08-03 ENCOUNTER — Other Ambulatory Visit: Payer: Self-pay | Admitting: Nurse Practitioner

## 2014-08-04 ENCOUNTER — Telehealth: Payer: Self-pay | Admitting: Nurse Practitioner

## 2014-08-16 ENCOUNTER — Ambulatory Visit: Payer: Medicaid Other | Admitting: Family Medicine

## 2014-08-22 ENCOUNTER — Other Ambulatory Visit: Payer: Self-pay | Admitting: Nurse Practitioner

## 2014-08-24 NOTE — Telephone Encounter (Signed)
Patient NTBS for follow up and lab work  

## 2014-09-22 ENCOUNTER — Other Ambulatory Visit: Payer: Self-pay | Admitting: Nurse Practitioner

## 2014-09-24 NOTE — Telephone Encounter (Signed)
no more refills without being seen  

## 2014-09-24 NOTE — Telephone Encounter (Signed)
Patient last seen in office on 05-12-14. Had an appt scheduled for 08-16-14 but cancelled. Last Keppra level on 01-12-14 and was normal. Please advise on refill

## 2014-10-20 ENCOUNTER — Other Ambulatory Visit: Payer: Self-pay | Admitting: Nurse Practitioner

## 2014-10-28 ENCOUNTER — Other Ambulatory Visit: Payer: Self-pay | Admitting: Nurse Practitioner

## 2014-10-28 NOTE — Telephone Encounter (Signed)
Last ov 6/15

## 2014-11-21 ENCOUNTER — Other Ambulatory Visit: Payer: Self-pay | Admitting: Nurse Practitioner

## 2014-11-22 ENCOUNTER — Telehealth: Payer: Self-pay | Admitting: Family Medicine

## 2014-11-22 ENCOUNTER — Ambulatory Visit: Payer: Medicaid Other | Admitting: Family Medicine

## 2014-11-24 NOTE — Telephone Encounter (Signed)
Patient states she is better that it has cleared up.

## 2014-12-19 ENCOUNTER — Other Ambulatory Visit: Payer: Self-pay | Admitting: Nurse Practitioner

## 2014-12-20 ENCOUNTER — Ambulatory Visit (INDEPENDENT_AMBULATORY_CARE_PROVIDER_SITE_OTHER): Payer: Medicaid Other | Admitting: Family Medicine

## 2014-12-20 ENCOUNTER — Encounter: Payer: Self-pay | Admitting: Family Medicine

## 2014-12-20 VITALS — BP 112/77 | HR 107 | Temp 99.8°F | Ht 61.5 in | Wt 211.0 lb

## 2014-12-20 DIAGNOSIS — L239 Allergic contact dermatitis, unspecified cause: Secondary | ICD-10-CM

## 2014-12-20 DIAGNOSIS — L200 Besnier's prurigo: Secondary | ICD-10-CM

## 2014-12-20 MED ORDER — PREDNISONE 10 MG PO TABS: ORAL_TABLET | ORAL | Status: DC

## 2014-12-20 MED ORDER — METHYLPREDNISOLONE ACETATE 80 MG/ML IJ SUSP
40.0000 mg | Freq: Once | INTRAMUSCULAR | Status: AC
  Administered 2014-12-20: 40 mg via INTRAMUSCULAR

## 2014-12-20 NOTE — Patient Instructions (Addendum)
Avoid scented soaps, fabric softeners, and detergents Artis FlockIvory, HanahanDove, and all dermatology approved detergent are okay. Also you can use scent free fabric softeners if needed. No body sprays or anything to have fragrance in them. Avoid [address] [place] and [employer]ever

## 2014-12-20 NOTE — Progress Notes (Signed)
   Subjective:    Patient ID: Kara Benson, female    DOB: March 09, 1996, 18 y.o.   MRN: [id number]  HPI Patient here tonight for rash that started on chest and is spreading. The patient has this history of skin sensitivity and allergies. She's been using and exposed to fragrances that she has not been exposed to recently.        Patient Active Problem List   Diagnosis Date Noted  . Seizure disorder 01/12/2014   Outpatient Encounter Prescriptions as of 12/20/2014  Medication Sig  . clotrimazole-betamethasone (LOTRISONE) cream Apply 1 application topically 2 (two) times daily.  . fluticasone (FLONASE) 50 MCG/ACT nasal spray Place 2 sprays into both nostrils daily.  Marland Kitchen. levETIRAcetam (KEPPRA) 250 MG tablet Take 250 mg by mouth daily.  Marland Kitchen. levETIRAcetam (KEPPRA) 750 MG tablet TAKE 1 TABLET (750 MG TOTAL) BY MOUTH AT BEDTIME.  . [DISCONTINUED] levETIRAcetam (KEPPRA) 750 MG tablet TAKE 1 TABLET BY MOUTH AT BEDTIME  . [DISCONTINUED] levETIRAcetam (KEPPRA) 250 MG tablet TAKE 1 TABLET BY MOUTH EVERY MORNING    Review of Systems  Constitutional: Negative.   HENT: Negative.   Eyes: Negative.   Respiratory: Negative.   Cardiovascular: Negative.   Gastrointestinal: Negative.   Endocrine: Negative.   Genitourinary: Negative.   Musculoskeletal: Negative.   Skin: Positive for rash.  Allergic/Immunologic: Negative.   Neurological: Negative.   Hematological: Negative.   Psychiatric/Behavioral: Negative.        Objective:   Physical Exam  Constitutional: She is oriented to person, place, and time. She appears well-developed and well-nourished. No distress.  HENT:  Head: Normocephalic.  Eyes: Conjunctivae and EOM are normal. Pupils are equal, round, and reactive to light. Right eye exhibits no discharge. Left eye exhibits no discharge. No scleral icterus.  Neck: Normal range of motion.  Musculoskeletal: Normal range of motion.  Neurological: She is alert and oriented to person, place, and  time.  Skin: Skin is warm and dry. Rash noted.  There is a fine rash on the upper chest and below both breasts and on the anterior abdomen.  Psychiatric: She has a normal mood and affect. Her behavior is normal. Judgment and thought content normal.   BP 112/77 mmHg  Pulse 107  Temp(Src) 99.8 F (37.7 C) (Oral)  Ht 5' 1.5" (1.562 m)  Wt 211 lb (95.709 kg)  BMI 39.23 kg/m2  LMP 12/20/2014        Assessment & Plan:  1. Allergic dermatitis -Depo-Medrol 40 mg -Prednisone 10 Taper #20, Taper over 2 days as directed -Avoid soaps or fabric softeners and detergents that are scented  Patient Instructions  Avoid scented soaps, fabric softeners, and detergents Artis FlockIvory, [place], and all dermatology approved detergent are okay. Also you can use scent free fabric softeners if needed. No body sprays or anything to have fragrance in them. Avoid [address] [place] and Lever   Nyra Capeson W. Rolen Conger MD

## 2014-12-23 ENCOUNTER — Other Ambulatory Visit: Payer: Self-pay | Admitting: Nurse Practitioner

## 2014-12-24 NOTE — Telephone Encounter (Signed)
Last seen 04/22/14 MMM

## 2015-01-20 ENCOUNTER — Other Ambulatory Visit: Payer: Self-pay | Admitting: Nurse Practitioner

## 2015-02-19 ENCOUNTER — Other Ambulatory Visit: Payer: Self-pay | Admitting: Nurse Practitioner

## 2015-02-22 ENCOUNTER — Other Ambulatory Visit: Payer: Self-pay | Admitting: Nurse Practitioner

## 2015-02-23 ENCOUNTER — Telehealth: Payer: Self-pay | Admitting: Nurse Practitioner

## 2015-02-23 NOTE — Telephone Encounter (Signed)
Was filled 02/21/15- call pharmacy

## 2015-02-23 NOTE — Telephone Encounter (Signed)
lmovm that refills have been sent to pharmacy

## 2015-02-23 NOTE — Telephone Encounter (Signed)
Sent Rx request from [facility] to Paulene FloorMary Martin to be approved.

## 2015-03-31 ENCOUNTER — Other Ambulatory Visit: Payer: Self-pay | Admitting: Nurse Practitioner

## 2015-06-06 ENCOUNTER — Other Ambulatory Visit: Payer: Self-pay | Admitting: Nurse Practitioner

## 2015-06-07 ENCOUNTER — Other Ambulatory Visit: Payer: Self-pay | Admitting: Nurse Practitioner

## 2015-06-07 MED ORDER — LEVETIRACETAM 250 MG PO TABS: ORAL_TABLET | ORAL | Status: DC

## 2015-07-07 ENCOUNTER — Other Ambulatory Visit: Payer: Self-pay | Admitting: *Deleted

## 2015-07-07 MED ORDER — LEVETIRACETAM 250 MG PO TABS: 250.0000 mg | ORAL_TABLET | Freq: Every morning | ORAL | Status: DC

## 2015-07-07 MED ORDER — LEVETIRACETAM 750 MG PO TABS
750.0000 mg | ORAL_TABLET | Freq: Every day | ORAL | Status: DC
Start: 2015-07-07 — End: 2015-07-07

## 2015-07-07 MED ORDER — LEVETIRACETAM 750 MG PO TABS: 750.0000 mg | ORAL_TABLET | Freq: Every day | ORAL | Status: DC

## 2015-07-08 ENCOUNTER — Telehealth: Payer: Self-pay | Admitting: Nurse Practitioner

## 2015-07-14 ENCOUNTER — Other Ambulatory Visit: Payer: Self-pay | Admitting: Nurse Practitioner

## 2015-07-15 ENCOUNTER — Other Ambulatory Visit: Payer: Self-pay | Admitting: *Deleted

## 2015-07-15 MED ORDER — LEVETIRACETAM 250 MG PO TABS: ORAL_TABLET | ORAL | Status: DC

## 2015-08-16 ENCOUNTER — Other Ambulatory Visit: Payer: Self-pay | Admitting: Nurse Practitioner

## 2015-08-16 NOTE — Telephone Encounter (Signed)
Done electronically.

## 2015-08-16 NOTE — Telephone Encounter (Signed)
One month supply only. Needs to be seen prior to further refills.

## 2015-08-16 NOTE — Telephone Encounter (Signed)
Detailed message left that rx has been sent in.  

## 2015-09-19 ENCOUNTER — Other Ambulatory Visit: Payer: Self-pay | Admitting: Family Medicine

## 2015-10-17 ENCOUNTER — Other Ambulatory Visit: Payer: Self-pay | Admitting: Family Medicine

## 2015-10-22 ENCOUNTER — Emergency Department (HOSPITAL_COMMUNITY)
Admission: EM | Admit: 2015-10-22 | Discharge: 2015-10-22 | Disposition: A | Discharge status: Home / Self Care | Payer: Self-pay | Attending: Emergency Medicine | Admitting: Emergency Medicine

## 2015-10-22 DIAGNOSIS — Z79899 Other long term (current) drug therapy: Secondary | ICD-10-CM | POA: Insufficient documentation

## 2015-10-22 DIAGNOSIS — J039 Acute tonsillitis, unspecified: Secondary | ICD-10-CM | POA: Insufficient documentation

## 2015-10-22 DIAGNOSIS — Z8659 Personal history of other mental and behavioral disorders: Secondary | ICD-10-CM | POA: Insufficient documentation

## 2015-10-22 DIAGNOSIS — G40909 Epilepsy, unspecified, not intractable, without status epilepticus: Secondary | ICD-10-CM | POA: Insufficient documentation

## 2015-10-22 LAB — CBC WITH DIFFERENTIAL/PLATELET
BASOS ABS: 0 10*3/uL (ref 0.0–0.1)
Basophils Relative: 0 %
EOS PCT: 0 %
Eosinophils Absolute: 0 10*3/uL (ref 0.0–0.7)
HCT: 31.7 % — ABNORMAL LOW (ref 36.0–46.0)
Hemoglobin: 10.1 g/dL — ABNORMAL LOW (ref 12.0–15.0)
LYMPHS PCT: 25 %
Lymphs Abs: 1.8 10*3/uL (ref 0.7–4.0)
MCH: 24.9 pg — ABNORMAL LOW (ref 26.0–34.0)
MCHC: 31.9 g/dL (ref 30.0–36.0)
MCV: 78.3 fL (ref 78.0–100.0)
MONO ABS: 0.8 10*3/uL (ref 0.1–1.0)
Monocytes Relative: 11 %
Neutro Abs: 4.7 10*3/uL (ref 1.7–7.7)
Neutrophils Relative %: 64 %
PLATELETS: 475 10*3/uL — AB (ref 150–400)
RBC: 4.05 MIL/uL (ref 3.87–5.11)
RDW: 15.7 % — AB (ref 11.5–15.5)
WBC: 7.3 10*3/uL (ref 4.0–10.5)

## 2015-10-22 LAB — I-STAT BETA HCG BLOOD, ED (MC, WL, AP ONLY)

## 2015-10-22 LAB — COMPREHENSIVE METABOLIC PANEL
ALT: 11 U/L — ABNORMAL LOW (ref 14–54)
AST: 17 U/L (ref 15–41)
Albumin: 3.8 g/dL (ref 3.5–5.0)
Alkaline Phosphatase: 72 U/L (ref 38–126)
Anion gap: 7 (ref 5–15)
BUN: 11 mg/dL (ref 6–20)
CHLORIDE: 104 mmol/L (ref 101–111)
CO2: 26 mmol/L (ref 22–32)
Calcium: 9.1 mg/dL (ref 8.9–10.3)
Creatinine, Ser: 0.84 mg/dL (ref 0.44–1.00)
Glucose, Bld: 96 mg/dL (ref 65–99)
POTASSIUM: 3.6 mmol/L (ref 3.5–5.1)
Sodium: 137 mmol/L (ref 135–145)
Total Bilirubin: 0.7 mg/dL (ref 0.3–1.2)
Total Protein: 8.6 g/dL — ABNORMAL HIGH (ref 6.5–8.1)

## 2015-10-22 LAB — RAPID STREP SCREEN (MED CTR MEBANE ONLY): STREPTOCOCCUS, GROUP A SCREEN (DIRECT): NEGATIVE

## 2015-10-22 MED ORDER — DEXAMETHASONE SODIUM PHOSPHATE 10 MG/ML IJ SOLN
10.0000 mg | Freq: Once | INTRAMUSCULAR | Status: AC
  Administered 2015-10-22: 10 mg via INTRAMUSCULAR
  Filled 2015-10-22: qty 1

## 2015-10-22 NOTE — ED Provider Notes (Signed)
MSE was initiated and I personally evaluated the patient and placed orders (if any) at  4:06 PM on October 22, 2015.  Patient here with multiple complaints. She states she had a positive pregnancy test a few days ago, test the following day was negative.  She has had abdominal cramping and breast tenderness.  Denies abnormal bleeding or discharge.  She states she is epileptic and is concerned about how this will affect her if she is pregnant.  Also complains of sore throat and difficulty swallowing after being treated for step throat with amoxicillin at another facility.  Denies fever.  No chest pain or SOB  Will order basic labs, rapid strep, u/a and urine preg.  Will move to acute side for further evaluation.  The patient appears stable so that the remainder of the MSE may be completed by another provider.  Garlon HatchetLisa M Dyshaun Bonzo, PA-C 10/22/15 1613  Geoffery Lyonsouglas Delo, MD 10/23/15 0900

## 2015-10-22 NOTE — ED Notes (Signed)
Pt reports a positive pregnancy test at home and Pt stated I have multiple problems.I have ABD pain . I have seizures , I was [facility]. And there are white patches on my throat.

## 2015-10-22 NOTE — ED Notes (Addendum)
Patient to ED for eval of sore throat and possible pregnancy. Last menstrual cycle beginning of November. Reports mild abdominal pain and spotting. Patient states she took pregnancy test at home, one was negative one was positive. Would like to know if she is pregnant.

## 2015-10-22 NOTE — ED Provider Notes (Signed)
CSN: [id number]     Arrival date & time 10/22/15  1535 History   First MD Initiated Contact with Patient 10/22/15 1554     Chief Complaint  Patient presents with  . Possible Pregnancy  . Abdominal Pain  . Sore Throat     (Consider location/radiation/quality/duration/timing/severity/associated sxs/prior Treatment) HPI Kara Benson is a 19 y.o. female who comes in for evaluation of possible pregnancy and sore throat. Patient reports she took a home as a test a few days ago that was positive. She denies any unusual vaginal bleeding or discharge, abdominal pain, nausea or vomiting. She also reports associated sore throat without coughing, fevers. She reports being treated for strep throat with amoxicillin at [facility] facility last week. She denies any other symptoms at this time. No other modifying factors.  Past Medical History  Diagnosis Date  . Seizures   . Anxiety    No past surgical history on file. Family History  Problem Relation Age of Onset  . Hypertension Mother   . Liver disease Father   . Hepatitis C Father   . Stroke Sister 17  . Diabetes Maternal Grandmother   . Heart disease Maternal Grandmother    Social History  Substance Use Topics  . Smoking status: Never Smoker   . Smokeless tobacco: Never Used  . Alcohol Use: No   OB History    No data available     Review of Systems A 10 point review of systems was completed and was negative except for pertinent positives and negatives as mentioned in the history of present illness     Allergies  Influenza vaccines  Home Medications   Prior to Admission medications   Medication Sig Start Date End Date Taking? Authorizing Provider  levETIRAcetam (KEPPRA) 250 MG tablet Take 250-750 mg by mouth 2 (two) times daily.  in the morning,  in the evening   Yes Historical Provider, MD   BP 123/86 mmHg  Pulse 97  Temp(Src) 98.8 F (37.1 C) (Oral)  Resp 16  SpO2 99% Physical Exam  Constitutional: She is  oriented to person, place, and time. She appears well-developed and well-nourished.  HENT:  Head: Normocephalic and atraumatic.  Mouth/Throat: Oropharynx is clear and moist.  Bilateral tonsillitis with exudates. Patent airway, tolerating secretions well. No trismus or glossal elevation. No evidence of abscess  Eyes: Conjunctivae are normal. Pupils are equal, round, and reactive to light. Right eye exhibits no discharge. Left eye exhibits no discharge. No scleral icterus.  Neck: Normal range of motion. Neck supple.  Cardiovascular: Normal rate, regular rhythm and normal heart sounds.   Pulmonary/Chest: Effort normal and breath sounds normal. No respiratory distress. She has no wheezes. She has no rales.  Abdominal: Soft. There is no tenderness.  Musculoskeletal: She exhibits no tenderness.  Neurological: She is alert and oriented to person, place, and time.  Cranial Nerves II-XII grossly intact  Skin: Skin is warm and dry. No rash noted.  Psychiatric: She has a normal mood and affect.  Nursing note and vitals reviewed.   ED Course  Procedures (including critical care time) Labs Review Labs Reviewed  CBC WITH DIFFERENTIAL/PLATELET - Abnormal; Notable for the following:    Hemoglobin 10.1 (*)    HCT 31.7 (*)    MCH 24.9 (*)    RDW 15.7 (*)    Platelets 475 (*)    All other components within normal limits  COMPREHENSIVE METABOLIC PANEL - Abnormal; Notable for the following:    Total Protein 8.6 (*)  ALT 11 (*)    All other components within normal limits  RAPID STREP SCREEN (NOT AT [facility])  CULTURE, GROUP A STREP  URINALYSIS, ROUTINE W REFLEX MICROSCOPIC (NOT AT [facility])  I-STAT BETA HCG BLOOD, ED ([facility], [facility], [facility] ONLY)    Imaging Review No results found. I have personally reviewed and evaluated these images and lab results as part of my medical decision-making.   EKG Interpretation None     Meds given in ED:  Medications  dexamethasone (DECADRON) injection 10 mg (10 mg  Intramuscular Given 10/22/15 1922)    New Prescriptions   No medications on file   Filed Vitals:   10/22/15 1549 10/22/15 1820  BP: 128/86 123/86  Pulse: 97 97  Temp: 98.5 F (36.9 C) 98.8 F (37.1 C)  TempSrc: Oral Oral  Resp: 14 16  SpO2: 98% 99%    MDM  Kara Benson is a [age] y.o. female who comes in for evaluation of sore throat and possible pregnancy. On arrival, she is hemodynamically stable with normal vital signs, afebrile. On exam she has bilateral, exudative tonsillitis. No trismus, glossal elevation or uvular deviation. She has a patent airway and is tolerating secretions well. Low suspicion for retropharyngeal or peritonsillar abscess. No Ludwig angina. Likely viral pharyngitis-low Centor score and rapid strep negative. She has a benign abdominal exam. Denies any abdominal pain whatsoever. Her pregnancy test is negative in the emergency department. Labs are otherwise unremarkable. No evidence of other acute or emergent pathology at this time. Overall, patient appears well, nontoxic and is appropriate for outpatient follow-up with her PCP next week. She verbalizes understanding and agrees with this plan. Final diagnoses:  Tonsillitis with exudate        Joycie PeekBenjamin Elija Mccamish, PA-C 10/22/15 1956  Mancel BaleElliott Wentz, MD 10/22/15 2320

## 2015-10-22 NOTE — ED Notes (Signed)
PT unable to collect urine at this time

## 2015-10-22 NOTE — Discharge Instructions (Signed)
Please follow-up with your doctor in the next week for reevaluation. Return to ED for any new or worsening symptoms including, but not limited to, fevers, difficulties breathing/swallowing or changes in your voice.  Tonsillitis Tonsillitis is an infection of the throat that causes the tonsils to become red, tender, and swollen. Tonsils are collections of lymphoid tissue at the back of the throat. Each tonsil has crevices (crypts). Tonsils help fight nose and throat infections and keep infection from spreading to other parts of the body for the first 18 months of life.  CAUSES Sudden (acute) tonsillitis is usually caused by infection with streptococcal bacteria. Long-lasting (chronic) tonsillitis occurs when the crypts of the tonsils become filled with pieces of food and bacteria, which makes it easy for the tonsils to become repeatedly infected. SYMPTOMS  Symptoms of tonsillitis include:  A sore throat, with possible difficulty swallowing.  White patches on the tonsils.  Fever.  Tiredness.  New episodes of snoring during sleep, when you did not snore before.  Small, foul-smelling, yellowish-white pieces of material (tonsilloliths) that you occasionally cough up or spit out. The tonsilloliths can also cause you to have bad breath. DIAGNOSIS Tonsillitis can be diagnosed through a physical exam. Diagnosis can be confirmed with the results of lab tests, including a throat culture. TREATMENT  The goals of tonsillitis treatment include the reduction of the severity and duration of symptoms and prevention of associated conditions. Symptoms of tonsillitis can be improved with the use of steroids to reduce the swelling. Tonsillitis caused by bacteria can be treated with antibiotic medicines. Usually, treatment with antibiotic medicines is started before the cause of the tonsillitis is known. However, if it is determined that the cause is not bacterial, antibiotic medicines will not treat the  tonsillitis. If attacks of tonsillitis are severe and frequent, your health care provider may recommend surgery to remove the tonsils (tonsillectomy). HOME CARE INSTRUCTIONS   Rest as much as possible and get plenty of sleep.  Drink plenty of fluids. While the throat is very sore, eat soft foods or liquids, such as sherbet, soups, or instant breakfast drinks.  Eat frozen ice pops.  Gargle with a warm or cold liquid to help soothe the throat. Mix 1/4 teaspoon of salt and 1/4 teaspoon of baking soda in 8 oz of water. SEEK MEDICAL CARE IF:   Large, tender lumps develop in your neck.  A rash develops.  A green, yellow-brown, or bloody substance is coughed up.  You are unable to swallow liquids or food for 24 hours.  You notice that only one of the tonsils is swollen. SEEK IMMEDIATE MEDICAL CARE IF:   You develop any new symptoms such as vomiting, severe headache, stiff neck, chest pain, or trouble breathing or swallowing.  You have severe throat pain along with drooling or voice changes.  You have severe pain, unrelieved with recommended medications.  You are unable to fully open the mouth.  You develop redness, swelling, or severe pain anywhere in the neck.  You have a fever. MAKE SURE YOU:   Understand these instructions.  Will watch your condition.  Will get help right away if you are not doing well or get worse.   This information is not intended to replace advice given to you by your health care provider. Make sure you discuss any questions you have with your health care provider.   Document Released: 08/15/2005 Document Revised: 11/26/2014 Document Reviewed: 04/24/2013 Elsevier Interactive Patient Education [employer]

## 2015-10-24 LAB — CULTURE, GROUP A STREP

## 2015-11-17 ENCOUNTER — Other Ambulatory Visit: Payer: Self-pay | Admitting: Family Medicine

## 2015-11-17 ENCOUNTER — Ambulatory Visit (INDEPENDENT_AMBULATORY_CARE_PROVIDER_SITE_OTHER): Payer: Self-pay | Admitting: Nurse Practitioner

## 2015-11-17 ENCOUNTER — Encounter: Payer: Self-pay | Admitting: Nurse Practitioner

## 2015-11-17 VITALS — BP 125/82 | HR 98 | Temp 98.2°F | Ht 61.5 in | Wt 218.0 lb

## 2015-11-17 DIAGNOSIS — Z6839 Body mass index (BMI) 39.0-39.9, adult: Secondary | ICD-10-CM

## 2015-11-17 DIAGNOSIS — G40909 Epilepsy, unspecified, not intractable, without status epilepticus: Secondary | ICD-10-CM

## 2015-11-17 MED ORDER — LEVETIRACETAM 250 MG PO TABS: 250.0000 mg | ORAL_TABLET | Freq: Two times a day (BID) | ORAL | Status: DC
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
## 2015-11-17 NOTE — Patient Instructions (Signed)
Seizure, Adult °A seizure means there is unusual activity in the brain. A seizure can cause changes in attention or behavior. Seizures often cause shaking (convulsions). Seizures often last from 30 seconds to 2 minutes. °HOME CARE  °· If you are given medicines, take them exactly as told by your doctor. °· Keep all doctor visits as told. °· Do not swim or drive until your doctor says it is okay. °· Teach others what to do if you have a seizure. They should: °¨ Lay you on the ground. °¨ Put a cushion under your head. °¨ Loosen any tight clothing around your neck. °¨ Turn you on your side. °¨ Stay with you until you get better. °GET HELP RIGHT AWAY IF:  °· The seizure lasts longer than 2 to 5 minutes. °· The seizure is very bad. °· The person does not wake up after the seizure. °· The person's attention or behavior changes. °Drive the person to the emergency room or call your local emergency services (911 in [place] °MAKE SURE YOU:  °· Understand these instructions. °· Will watch your condition. °· Will get help right away if you are not doing well or get worse. °  °This information is not intended to replace advice given to you by your health care provider. Make sure you discuss any questions you have with your health care provider. °  °Document Released: 04/23/2008 Document Revised: 01/28/2012 Document Reviewed: 06/17/2013 °Elsevier Interactive Patient Education ©2016 [employer] ° °

## 2015-12-01 ENCOUNTER — Emergency Department (HOSPITAL_COMMUNITY)
Admission: EM | Admit: 2015-12-01 | Discharge: 2015-12-01 | Discharge status: Against Medical Advice | Payer: Self-pay | Attending: Emergency Medicine | Admitting: Emergency Medicine

## 2015-12-01 ENCOUNTER — Encounter (HOSPITAL_COMMUNITY): Payer: Self-pay | Admitting: *Deleted

## 2015-12-01 DIAGNOSIS — R1030 Lower abdominal pain, unspecified: Secondary | ICD-10-CM | POA: Insufficient documentation

## 2015-12-01 LAB — URINALYSIS, ROUTINE W REFLEX MICROSCOPIC
Bilirubin Urine: NEGATIVE
GLUCOSE, UA: NEGATIVE mg/dL
HGB URINE DIPSTICK: NEGATIVE
Ketones, ur: NEGATIVE mg/dL
Nitrite: NEGATIVE
PH: 6 (ref 5.0–8.0)
Protein, ur: NEGATIVE mg/dL
SPECIFIC GRAVITY, URINE: 1.031 — AB (ref 1.005–1.030)

## 2015-12-01 LAB — URINE MICROSCOPIC-ADD ON

## 2015-12-01 LAB — POC URINE PREG, ED: Preg Test, Ur: NEGATIVE

## 2015-12-01 NOTE — ED Notes (Signed)
Pt c/o pain to groin area since this morning. Concerned for yeast infection. Denies discharge and denies painful urination

## 2016-01-28 ENCOUNTER — Other Ambulatory Visit: Payer: Self-pay | Admitting: Nurse Practitioner

## 2016-02-01 ENCOUNTER — Telehealth: Payer: Self-pay | Admitting: Nurse Practitioner

## 2016-02-01 MED ORDER — LEVETIRACETAM 250 MG PO TABS: ORAL_TABLET | ORAL | Status: DC

## 2016-02-01 NOTE — Telephone Encounter (Signed)
Patient notified that prescription was sent with correct number of tablets.

## 2016-03-26 ENCOUNTER — Other Ambulatory Visit: Payer: Self-pay | Admitting: Nurse Practitioner

## 2016-03-27 NOTE — Telephone Encounter (Signed)
Covering for PCP  refill keppra  Kara SinkSam Alee Gressman, MD [facility] 03/27/2016, 9:06 AM

## 2016-03-27 NOTE — Telephone Encounter (Signed)
Last seen 11/17/15  MMM 

## 2016-04-18 ENCOUNTER — Emergency Department (HOSPITAL_COMMUNITY): Payer: Self-pay

## 2016-04-18 ENCOUNTER — Encounter (HOSPITAL_COMMUNITY): Payer: Self-pay | Admitting: Emergency Medicine

## 2016-04-18 ENCOUNTER — Emergency Department (HOSPITAL_COMMUNITY)
Admission: EM | Admit: 2016-04-18 | Discharge: 2016-04-18 | Disposition: A | Discharge status: Home / Self Care | Payer: Self-pay | Attending: Emergency Medicine | Admitting: Emergency Medicine

## 2016-04-18 DIAGNOSIS — G40909 Epilepsy, unspecified, not intractable, without status epilepticus: Secondary | ICD-10-CM | POA: Insufficient documentation

## 2016-04-18 DIAGNOSIS — Z87898 Personal history of other specified conditions: Secondary | ICD-10-CM

## 2016-04-18 DIAGNOSIS — Z79899 Other long term (current) drug therapy: Secondary | ICD-10-CM | POA: Insufficient documentation

## 2016-04-18 LAB — I-STAT CHEM 8, ED
BUN: 9 mg/dL (ref 6–20)
CALCIUM ION: 1.16 mmol/L (ref 1.12–1.23)
Chloride: 103 mmol/L (ref 101–111)
Creatinine, Ser: 0.9 mg/dL (ref 0.44–1.00)
GLUCOSE: 111 mg/dL — AB (ref 65–99)
HCT: 30 % — ABNORMAL LOW (ref 36.0–46.0)
HEMOGLOBIN: 10.2 g/dL — AB (ref 12.0–15.0)
POTASSIUM: 3.5 mmol/L (ref 3.5–5.1)
Sodium: 141 mmol/L (ref 135–145)
TCO2: 25 mmol/L (ref 0–100)

## 2016-04-18 LAB — I-STAT BETA HCG BLOOD, ED (MC, WL, AP ONLY)

## 2016-04-18 MED ORDER — SODIUM CHLORIDE 0.9 % IV SOLN
500.0000 mg | Freq: Once | INTRAVENOUS | Status: DC
  Filled 2016-04-18: qty 5

## 2016-04-18 MED ORDER — LEVETIRACETAM 250 MG PO TABS
250.0000 mg | ORAL_TABLET | Freq: Once | ORAL | Status: DC
  Filled 2016-04-18: qty 1

## 2016-04-18 MED ORDER — SODIUM CHLORIDE 0.9 % IV BOLUS (SEPSIS)
1000.0000 mL | Freq: Once | INTRAVENOUS | Status: AC
  Administered 2016-04-18: 1000 mL via INTRAVENOUS

## 2016-04-18 NOTE — Progress Notes (Signed)
Pt confirms Kara Benson as her pcp of choice and confirms she has not seen pcp in awhile and she can make her own appt to see pcp for follow up care Pt confirms also she has discount medication cards with her pharmacy to assist with any Rx/medication cost on d/c

## 2016-04-18 NOTE — ED Notes (Signed)
Patient transported to CT 

## 2016-04-18 NOTE — Discharge Instructions (Signed)
Take all your medications as prescribed. Please follow up with your primary care provider as soon as possible. When your insurance is fixed please follow up with neurology. Return to the ER for new or worsening symptoms.
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
## 2016-04-18 NOTE — ED Notes (Signed)
Per EMS, patient has hx of seizures, "thinks she forgot to take her keppra this morning and thinks she might have had a seizure this morning."

## 2016-04-18 NOTE — ED Notes (Signed)
Discharge instructions and follow up care reviewed with patient. Patient verbalized understanding. 

## 2016-04-18 NOTE — ED Notes (Signed)
Bed: WA05 Expected date:  Expected time:  Means of arrival:  Comments: EMS "possible seizure" 

## 2016-04-24 ENCOUNTER — Other Ambulatory Visit: Payer: Self-pay | Admitting: Family Medicine

## 2016-04-24 ENCOUNTER — Emergency Department (HOSPITAL_COMMUNITY): Payer: Self-pay

## 2016-04-24 ENCOUNTER — Encounter (HOSPITAL_COMMUNITY): Payer: Self-pay | Admitting: Emergency Medicine

## 2016-04-24 ENCOUNTER — Emergency Department (HOSPITAL_COMMUNITY)
Admission: EM | Admit: 2016-04-24 | Discharge: 2016-04-24 | Disposition: A | Discharge status: Home / Self Care | Payer: Self-pay | Attending: Emergency Medicine | Admitting: Emergency Medicine

## 2016-04-24 DIAGNOSIS — R531 Weakness: Secondary | ICD-10-CM | POA: Insufficient documentation

## 2016-04-24 DIAGNOSIS — M25512 Pain in left shoulder: Secondary | ICD-10-CM | POA: Insufficient documentation

## 2016-04-24 NOTE — Telephone Encounter (Signed)
Last seen 11/17/15  MMM 

## 2016-04-24 NOTE — Discharge Instructions (Signed)
Please read and follow all provided instructions.  Your diagnoses today include:  1. Left shoulder pain    Tests performed today include:  Vital signs. See below for your results today.   Medications prescribed:   Take as prescribed   Home care instructions:  Follow any educational materials contained in this packet.  Follow-up instructions: Please follow-up with your Orthopedics for further evaluation of symptoms and treatment if symptoms do not improve  Return instructions:   Please return to the Emergency Department if you do not get better, if you get worse, or new symptoms OR  - Fever (temperature greater than 101.22F)  - Bleeding that does not stop with holding pressure to the area    -Severe pain (please note that you may be more sore the day after your accident)  - Chest Pain  - Difficulty breathing  - Severe nausea or vomiting  - Inability to tolerate food and liquids  - Passing out  - Skin becoming red around your wounds  - Change in mental status (confusion or lethargy)  - New numbness or weakness     Please return if you have any other emergent concerns.  Additional Information:  Your vital signs today were: BP 117/82 mmHg   Pulse 100   Temp(Src) 98.7 F (37.1 C) (Oral)   Resp 18   SpO2 100%   LMP 04/08/2016 If your blood pressure (BP) was elevated above 135/85 this visit, please have this repeated by your doctor within one month. ---------------

## 2016-04-24 NOTE — ED Provider Notes (Signed)
CSN: [id number]     Arrival date & time 04/24/16  1317 History  By signing my name below, I, Kara Benson, attest that this documentation has been prepared under the direction and in the presence of Audry Piliyler [name], PA-C Electronically Signed: Soijett Benson, ED Scribe. 04/24/2016. 2:44 PM.   Chief Complaint  Patient presents with  . Shoulder Pain    The history is provided by the patient. No language interpreter was used.   Kara Benson is a 20 y.o. female who presents to the Emergency Department via EMS complaining of left shoulder pain onset today PTA. Pt states that she was taking a nap on her stomach onto her left shoulder and heard a cracking sound to her left shoulder. Pt initially felt lightheaded at the time of the incident. Pt notes that when EMS arrived, pt felt her left shoulder move back into place. Pt notes that she has 10/10 left shoulder pain with movement. Pt reports that she has had ongoing left shoulder pain/dislocation since November 2016 when she had a seizure that resulted in posterior left shoulder dislocation. Pt states that her last seizure was last week and she felt her left shoulder attempt to dislocate. Pt has associated symptoms of left arm weakness. She notes that she has not tried any medications for the relief of her symptoms. She denies color change, wound, rash, and any other symptoms. Denies ever seeing orthopedist in the past. Denies seeing her PCP due to insurance issues.   Past Medical History  Diagnosis Date  . Seizures (HCC)   . Anxiety    History reviewed. No pertinent past surgical history. Family History  Problem Relation Age of Onset  . Hypertension Mother   . Liver disease Father   . Hepatitis C Father   . Stroke Sister [id number]  . Diabetes Maternal Grandmother   . Heart disease Maternal Grandmother    Social History  Substance Use Topics  . Smoking status: Never Smoker   . Smokeless tobacco: Never Used  . Alcohol Use: No   OB History    No data  available     Review of Systems  Musculoskeletal: Positive for arthralgias (left shoulder). Negative for joint swelling.  Skin: Negative for color change, rash and wound.  Neurological: Positive for weakness and light-headedness.   Allergies  Influenza vaccines and Keppra  Home Medications   Prior to Admission medications   Medication Sig Start Date End Date Taking? Authorizing Provider  levETIRAcetam (KEPPRA) 250 MG tablet TAKE 1 TABLET IN THE MORNING AND 3 TABLETS IN THE EVENING. 03/27/16   [name], MD   BP 117/82 mmHg  Pulse 100  Temp(Src) 98.7 F (37.1 C) (Oral)  Resp 18  SpO2 100%  LMP 04/08/2016   Physical Exam  Constitutional: She is oriented to person, place, and time. She appears well-developed and well-nourished. No distress.  HENT:  Head: Normocephalic and atraumatic.  Eyes: EOM are normal.  Neck: Neck supple.  Cardiovascular: Normal rate.   Pulmonary/Chest: Effort normal. No respiratory distress.  Abdominal: She exhibits no distension.  Musculoskeletal:       Left shoulder: She exhibits decreased range of motion. She exhibits no tenderness, no bony tenderness, no swelling, no deformity and normal pulse.  Non-TTP along left shoulder joint. Grip strength equal bilaterally. NVI. Active ROM with moderate pain on abduction. Full Passive ROM.   Neurological: She is alert and oriented to person, place, and time.  Skin: Skin is warm and dry.  Psychiatric: She  has a normal mood and affect. Her behavior is normal.  Nursing note and vitals reviewed.  ED Course  Procedures (including critical care time) DIAGNOSTIC STUDIES: Oxygen Saturation is 100% on RA, nl by my interpretation.    COORDINATION OF CARE: 2:00 PM Discussed treatment plan with pt at bedside which includes left shoulder xray, referral to orthopedist, and ibuprofen, and pt agreed to plan.  Labs Review Labs Reviewed - No data to display  Imaging Review Dg Shoulder Left  04/24/2016  CLINICAL  DATA:  Acute left shoulder pain. History of shoulder dislocations. Initial encounter. EXAM: LEFT SHOULDER - 2+ VIEW COMPARISON:  None. FINDINGS: There is no evidence of fracture or dislocation. There is no evidence of arthropathy or other focal bone abnormality. Soft tissues are unremarkable. IMPRESSION: Negative. Electronically Signed   By: Harmon Pier M.D.   On: 04/24/2016 14:32   I have personally reviewed and evaluated these images as part of my medical decision-making.   EKG Interpretation None      MDM  I have reviewed and evaluated the relevant imaging studies.  I have reviewed the relevant previous healthcare records. I obtained HPI from historian.  ED Course:  Assessment: Pt with hx shoulder dislocation from seizure. Patient X-Ray negative for obvious fracture or dislocation.  Pt advised to follow up with orthopedics if symptoms do not improve. Possible rotator cuff sprain vs. tear. Advised use of arm sling, conservative therapy recommended and discussed. Patient will be discharged home & is agreeable with above plan. Returns precautions discussed. Pt appears safe for discharge.  Disposition/Plan:  DC home Additional Verbal discharge instructions given and discussed with patient.  Pt Instructed to f/u with Ortho for evaluation and treatment of symptoms if no improvement. Return precautions given Pt acknowledges and agrees with plan  Supervising Physician Lorre Nick, MD   Final diagnoses:  Left shoulder pain    I personally performed the services described in this documentation, which was scribed in my presence. The recorded information has been reviewed and is accurate.   [name], PA-C 04/24/16 1500  Lorre Nick, MD 04/25/16 2023

## 2016-04-24 NOTE — ED Notes (Addendum)
Per EMS pt comes from home for left shoulder pain.  Patient has history of shoulder coming out of socket.  Patient unable to drive to ED because "that is my driving arm"  Patient reported that last time she was seen here for pain she was given Percocet. Patient adds that had seizure year ago and left arm came out of socket and hasn't felt right since.

## 2016-05-24 ENCOUNTER — Other Ambulatory Visit: Payer: Self-pay | Admitting: Nurse Practitioner

## 2016-05-28 ENCOUNTER — Encounter: Payer: Self-pay | Admitting: Nurse Practitioner

## 2016-05-28 ENCOUNTER — Other Ambulatory Visit: Payer: Self-pay | Admitting: Nurse Practitioner

## 2016-05-28 NOTE — Telephone Encounter (Signed)
Pt notified letter is ready for pick up Letter to front for pick up

## 2016-05-28 NOTE — Telephone Encounter (Signed)
Spoke with pt and advised we are waiting on MMM to approve the rx. Pt is aware we will contact her as soon as MMM has addressed the refill request.

## 2016-05-28 NOTE — Telephone Encounter (Signed)
Urgent

## 2016-05-28 NOTE — Telephone Encounter (Signed)
Letter ready for pick up

## 2016-05-29 ENCOUNTER — Encounter: Payer: Self-pay | Admitting: Family

## 2016-05-29 ENCOUNTER — Ambulatory Visit (INDEPENDENT_AMBULATORY_CARE_PROVIDER_SITE_OTHER): Payer: Self-pay | Admitting: Family

## 2016-05-29 VITALS — BP 118/77 | HR 94 | Temp 99.1°F | Ht 62.0 in | Wt 220.0 lb

## 2016-05-29 DIAGNOSIS — G40909 Epilepsy, unspecified, not intractable, without status epilepticus: Secondary | ICD-10-CM

## 2016-05-29 DIAGNOSIS — Z6839 Body mass index (BMI) 39.0-39.9, adult: Secondary | ICD-10-CM

## 2016-05-29 MED ORDER — LEVETIRACETAM 250 MG PO TABS: ORAL_TABLET | ORAL | Status: DC

## 2016-05-29 NOTE — Progress Notes (Signed)
   Subjective:    Patient ID: Kara Benson, female    DOB: [birth year], 20 y.o.   MRN: [id number]  PT presents to the office today for chronic follow up. PT has a history of seizures since she was 20 years old. PT currently taking Keppra 250 mg in AM and 750 mg in evening.  Seizures  This is a chronic problem. Episode onset: Last seizure was June 2017. Pertinent negatives include no headaches. Characteristics include bladder incontinence. The episode was witnessed. There was the sensation of an aura present.      Review of Systems  Constitutional: Negative.   HENT: Negative.   Eyes: Negative.   Respiratory: Negative.  Negative for shortness of breath.   Cardiovascular: Negative.  Negative for palpitations.  Gastrointestinal: Negative.   Endocrine: Negative.   Genitourinary: Positive for bladder incontinence.  Musculoskeletal: Negative.   Neurological: Positive for seizures. Negative for headaches.  Hematological: Negative.   Psychiatric/Behavioral: Negative.   All other systems reviewed and are negative.      Objective:   Physical Exam  Constitutional: She is oriented to person, place, and time. She appears well-developed and well-nourished. No distress.  Obesity   HENT:  Head: Normocephalic and atraumatic.  Right Ear: External ear normal.  Left Ear: External ear normal.  Nose: Nose normal.  Mouth/Throat: Oropharynx is clear and moist.  Eyes: Pupils are equal, round, and reactive to light.  Neck: Normal range of motion. Neck supple. No thyromegaly present.  Cardiovascular: Normal rate, regular rhythm, normal heart sounds and intact distal pulses.   No murmur heard. Pulmonary/Chest: Effort normal and breath sounds normal. No respiratory distress. She has no wheezes.  Abdominal: Soft. Bowel sounds are normal. She exhibits no distension. There is no tenderness.  Musculoskeletal: Normal range of motion. She exhibits no edema or tenderness.  Neurological: She is alert and  oriented to person, place, and time. She has normal reflexes. No cranial nerve deficit.  Skin: Skin is warm and dry.  Psychiatric: She has a normal mood and affect. Her behavior is normal. Judgment and thought content normal.  Vitals reviewed.   Temp(Src) 99.1 F (37.3 C) (Oral)  Ht 5\' 2"  (1.575 m)  Wt 220 lb (99.791 kg)  BMI 40.23 kg/m2       Assessment & Plan:  1. Seizure disorder (HCC) - Levetiracetam level  2. BMI 39.0-39.9,adult - Levetiracetam level   Continue all meds Labs pending Health Maintenance reviewed Diet and exercise encouraged RTO 1 year, encouraged pt to make appt for Pap  Jannifer Rodneyhristy Caden Fatica, FNP

## 2016-05-29 NOTE — Patient Instructions (Signed)

## 2016-05-31 LAB — LEVETIRACETAM LEVEL: Levetiracetam Lvl: 4.5 ug/mL — ABNORMAL LOW (ref 10.0–40.0)

## 2016-06-01 ENCOUNTER — Telehealth: Payer: Self-pay | Admitting: Nurse Practitioner

## 2016-06-01 ENCOUNTER — Other Ambulatory Visit: Payer: Self-pay | Admitting: Family

## 2016-06-01 MED ORDER — LEVETIRACETAM 250 MG PO TABS: ORAL_TABLET | ORAL | Status: DC

## 2016-06-01 NOTE — Telephone Encounter (Signed)
Please review labs and advise.

## 2016-06-04 ENCOUNTER — Telehealth: Payer: Self-pay

## 2016-06-04 DIAGNOSIS — R739 Hyperglycemia, unspecified: Secondary | ICD-10-CM

## 2016-06-04 NOTE — Telephone Encounter (Signed)
Hgb A1c added. 

## 2016-06-04 NOTE — Telephone Encounter (Signed)
Patient aware of her lab results. Patient states that an A1c was supposed to get done but I don't see it in the chart. Please advise if it can be added.

## 2016-06-04 NOTE — Telephone Encounter (Signed)
Patient aware.

## 2016-06-22 ENCOUNTER — Emergency Department (HOSPITAL_COMMUNITY)
Admission: EM | Admit: 2016-06-22 | Discharge: 2016-06-22 | Disposition: A | Discharge status: Home / Self Care | Payer: Self-pay | Attending: Emergency Medicine | Admitting: Emergency Medicine

## 2016-06-22 ENCOUNTER — Encounter (HOSPITAL_COMMUNITY): Payer: Self-pay | Admitting: Emergency Medicine

## 2016-06-22 DIAGNOSIS — N730 Acute parametritis and pelvic cellulitis: Secondary | ICD-10-CM

## 2016-06-22 DIAGNOSIS — N739 Female pelvic inflammatory disease, unspecified: Secondary | ICD-10-CM | POA: Insufficient documentation

## 2016-06-22 LAB — URINALYSIS, ROUTINE W REFLEX MICROSCOPIC
Bilirubin Urine: NEGATIVE
Glucose, UA: NEGATIVE mg/dL
Hgb urine dipstick: NEGATIVE
KETONES UR: NEGATIVE mg/dL
NITRITE: NEGATIVE
PH: 6.5 (ref 5.0–8.0)
PROTEIN: NEGATIVE mg/dL
Specific Gravity, Urine: 1.025 (ref 1.005–1.030)

## 2016-06-22 LAB — WET PREP, GENITAL
Sperm: NONE SEEN
Trich, Wet Prep: NONE SEEN
Yeast Wet Prep HPF POC: NONE SEEN

## 2016-06-22 LAB — PREGNANCY, URINE: PREG TEST UR: NEGATIVE

## 2016-06-22 LAB — URINE MICROSCOPIC-ADD ON

## 2016-06-22 MED ORDER — LIDOCAINE HCL (PF) 1 % IJ SOLN
INTRAMUSCULAR | Status: AC
  Administered 2016-06-22: 1 mL
  Filled 2016-06-22: qty 5

## 2016-06-22 MED ORDER — DOXYCYCLINE HYCLATE 100 MG PO CAPS: 100.0000 mg | ORAL_CAPSULE | Freq: Two times a day (BID) | ORAL | 0 refills | Status: AC

## 2016-06-22 MED ORDER — CEFTRIAXONE SODIUM 250 MG IJ SOLR
250.0000 mg | Freq: Once | INTRAMUSCULAR | Status: AC
  Administered 2016-06-22: 250 mg via INTRAMUSCULAR
  Filled 2016-06-22: qty 250

## 2016-06-22 MED ORDER — AZITHROMYCIN 250 MG PO TABS
1000.0000 mg | ORAL_TABLET | Freq: Once | ORAL | Status: AC
  Administered 2016-06-22: 1000 mg via ORAL
  Filled 2016-06-22: qty 4

## 2016-06-22 MED ORDER — METRONIDAZOLE 500 MG PO TABS
2000.0000 mg | ORAL_TABLET | Freq: Once | ORAL | Status: AC
  Administered 2016-06-22: 2000 mg via ORAL
  Filled 2016-06-22: qty 4

## 2016-06-22 NOTE — ED Notes (Signed)
Patient Alert and oriented X4. Stable and ambulatory. Patient verbalized understanding of the discharge instructions.  Patient belongings were taken by the patient.  

## 2016-06-22 NOTE — ED Provider Notes (Addendum)
[facility]-EMERGENCY DEPT Provider Note   CSN: [id number] Arrival date & time: 06/22/16  1402  First Provider Contact:  None       History   Chief Complaint Chief Complaint  Patient presents with  . Abdominal Pain    HPI Kara Benson is a 20 y.o. female. She takes a few minutes to ultimately decide why she is here. She also states that she has pain "around my waist". She states that she is sexually active with one person" he is really big". She states that sometimes "it hurts when he puts it in May and sometimes I cry". She states that she is not concerned about being pregnant because "he pulls out". When asked if she has any discharge she states "not anything that is coming out unless it's just staying up inside of me".  No dyspareunia then entrance dyspareunia. No postcoital bleeding. No dysuria figures here hematuria. She states she is to have pain like this "just before my period". Last menstrual cycle was 2 weeks ago.  She does not take contraceptives because "I have seizures".  HPI  Past Medical History:  Diagnosis Date  . Anxiety   . Seizures [facility])     Patient Active Problem List   Diagnosis Date Noted  . BMI 39.0-39.9,adult 11/17/2015  . Seizure disorder (HCC) 01/12/2014    History reviewed. No pertinent surgical history.  OB History    No data available       Home Medications    Prior to Admission medications   Medication Sig Start Date End Date Taking? Authorizing Provider  doxycycline (VIBRAMYCIN) 100 MG capsule Take 1 capsule (100 mg total) by mouth 2 (two) times daily. 06/22/16   Rolland Porter, MD  levETIRAcetam (KEPPRA) 250 MG tablet TAKE 2 TABLETs IN THE MORNING AND 3 TABLETS IN THE EVENING. 06/01/16   Junie Spencer, FNP    Family History Family History  Problem Relation Age of Onset  . Hypertension Mother   . Liver disease Father   . Hepatitis C Father   . Stroke Sister 12  . Diabetes Maternal Grandmother   . Heart disease Maternal Grandmother      Social History Social History  Substance Use Topics  . Smoking status: Never Smoker  . Smokeless tobacco: Never Used  . Alcohol use No     Allergies   Influenza vaccines and Keppra [levetiracetam]   Review of Systems Review of Systems  Constitutional: Negative for appetite change, chills, diaphoresis, fatigue and fever.  HENT: Negative for mouth sores, sore throat and trouble swallowing.   Eyes: Negative for visual disturbance.  Respiratory: Negative for cough, chest tightness, shortness of breath and wheezing.   Cardiovascular: Negative for chest pain.  Gastrointestinal: Negative for abdominal distention, abdominal pain, diarrhea, nausea and vomiting.  Endocrine: Negative for polydipsia, polyphagia and polyuria.  Genitourinary: Positive for pelvic pain. Negative for dysuria, frequency and hematuria.  Musculoskeletal: Negative for gait problem.  Skin: Negative for color change, pallor and rash.  Neurological: Negative for dizziness, syncope, light-headedness and headaches.  Hematological: Does not bruise/bleed easily.  Psychiatric/Behavioral: Negative for behavioral problems and confusion.     Physical Exam Updated Vital Signs BP 138/79   Pulse 93   Temp 98.3 F (36.8 C) (Oral)   Resp 18   Wt 218 lb 11.2 oz (99.2 kg)   SpO2 100%   BMI 40.00 kg/m   Physical Exam  Constitutional: She is oriented to person, place, and time. She appears well-developed and well-nourished.  No distress.  HENT:  Head: Normocephalic.  Eyes: Conjunctivae are normal. Pupils are equal, round, and reactive to light. No scleral icterus.  Neck: Normal range of motion. Neck supple. No thyromegaly present.  Cardiovascular: Normal rate and regular rhythm.  Exam reveals no gallop and no friction rub.   No murmur heard. Pulmonary/Chest: Effort normal and breath sounds normal. No respiratory distress. She has no wheezes. She has no rales.  Abdominal: Soft. Bowel sounds are normal. She exhibits no  distension. There is no tenderness. There is no rebound.    Musculoskeletal: Normal range of motion.  Neurological: She is alert and oriented to person, place, and time.  Skin: Skin is warm and dry. No rash noted.  Psychiatric: She has a normal mood and affect. Her behavior is normal.     ED Treatments / Results  Labs (all labs ordered are listed, but only abnormal results are displayed) Labs Reviewed  WET PREP, GENITAL - Abnormal; Notable for the following:       Result Value   Clue Cells Wet Prep HPF POC PRESENT (*)    WBC, Wet Prep HPF POC MANY (*)    All other components within normal limits  URINALYSIS, ROUTINE W REFLEX MICROSCOPIC (NOT AT [facility]) - Abnormal; Notable for the following:    Leukocytes, UA SMALL (*)    All other components within normal limits  URINE MICROSCOPIC-ADD ON - Abnormal; Notable for the following:    Squamous Epithelial / LPF 0-5 (*)    Bacteria, UA FEW (*)    All other components within normal limits  PREGNANCY, URINE  GC/CHLAMYDIA PROBE AMP ([place]) NOT AT [facility]    EKG  EKG Interpretation None       Radiology No results found.  Procedures Procedures (including critical care time)  Medications Ordered in ED Medications  cefTRIAXone (ROCEPHIN) injection 250 mg (not administered)  azithromycin (ZITHROMAX) tablet 1,000 mg (not administered)  metroNIDAZOLE (FLAGYL) tablet 2,000 mg (not administered)     Initial Impression / Assessment and Plan / ED Course  I have reviewed the triage vital signs and the nursing notes.  Pertinent labs & imaging results that were available during my care of the patient were reviewed by me and considered in my medical decision making (see chart for details).  Clinical Course    Pelvic exam shows purulent discharge and a friable cervix. Patient refuses bimanual exam. She states "no ones ever done that before". She states she has had pelvic exams done before but again states she's never had a bimanual  exam thus I have not performed 1. I think her symptoms are most consistent with pelvic inflammatory disease. She is not pregnant. Treated with Rocephin, Zithromax, Flagyl. 7 days [place]. Strongly encouraged to have her partner treated and evaluated.  Final Clinical Impressions(s) / ED Diagnoses   Final diagnoses:  PID (acute pelvic inflammatory disease)    New Prescriptions New Prescriptions   DOXYCYCLINE (VIBRAMYCIN) 100 MG CAPSULE    Take 1 capsule (100 mg total) by mouth 2 (two) times daily.     Rolland Porter, MD 06/22/16 [place]    Rolland Porter, MD 06/22/16 [phone]

## 2016-06-22 NOTE — ED Triage Notes (Signed)
Pt sts lower abd pain without other complaint; pt sts LMP was 2 weeks ago; pt denies urinary sx

## 2016-06-22 NOTE — Discharge Instructions (Signed)
Your sexual partner needs to be evaluated by a physician and treated.   If not, you will redevelop the same infection.

## 2016-06-25 ENCOUNTER — Telehealth (HOSPITAL_BASED_OUTPATIENT_CLINIC_OR_DEPARTMENT_OTHER): Payer: Self-pay | Admitting: Emergency Medicine

## 2016-06-25 LAB — GC/CHLAMYDIA PROBE AMP (~~LOC~~) NOT AT ARMC
CHLAMYDIA, DNA PROBE: NEGATIVE
Neisseria Gonorrhea: NEGATIVE

## 2016-07-19 ENCOUNTER — Telehealth: Payer: Self-pay | Admitting: Nurse Practitioner

## 2016-07-19 ENCOUNTER — Encounter: Payer: Self-pay | Admitting: Nurse Practitioner

## 2016-07-19 NOTE — Telephone Encounter (Signed)
Letter placed up front for mom to pick up

## 2016-07-19 NOTE — Telephone Encounter (Signed)
Letter ready for pick up

## 2016-09-19 ENCOUNTER — Other Ambulatory Visit: Payer: Self-pay | Admitting: Nurse Practitioner

## 2016-09-19 ENCOUNTER — Other Ambulatory Visit: Payer: Self-pay | Admitting: Family

## 2016-09-20 NOTE — Telephone Encounter (Signed)
Patient aware that medication was sent to [facility]

## 2017-01-14 ENCOUNTER — Other Ambulatory Visit: Payer: Self-pay | Admitting: Family

## 2017-02-19 ENCOUNTER — Other Ambulatory Visit: Payer: Self-pay | Admitting: Family

## 2017-07-24 IMAGING — DX DG SHOULDER 2+V*L*
4 series · 4 of 4 positions shown · non-contrast
Comparison: None.

CLINICAL DATA: Acute left shoulder pain. History of shoulder
dislocations. Initial encounter.

EXAM:
LEFT SHOULDER - 2+ VIEW

[shoulder grashey]
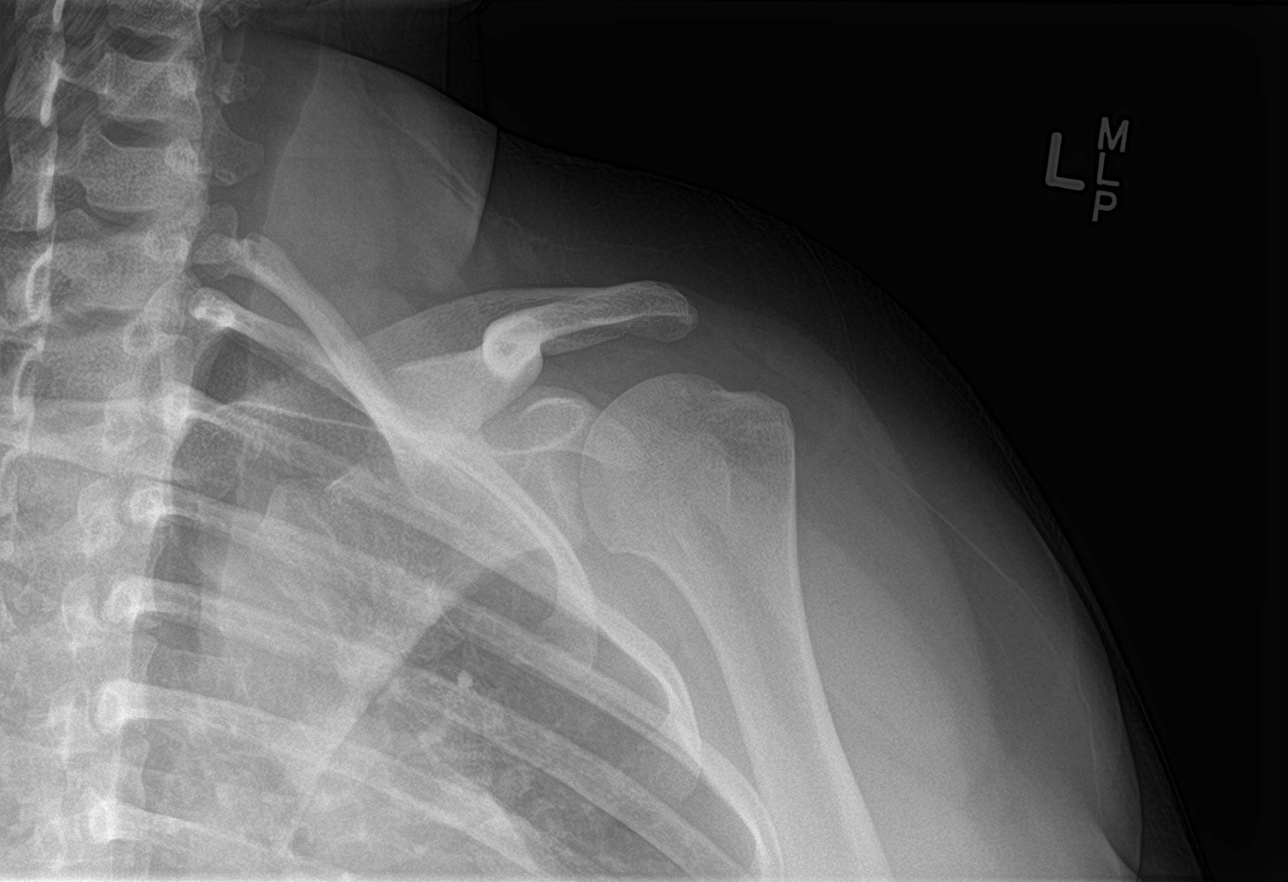

[shoulder y view (1 of 2)]
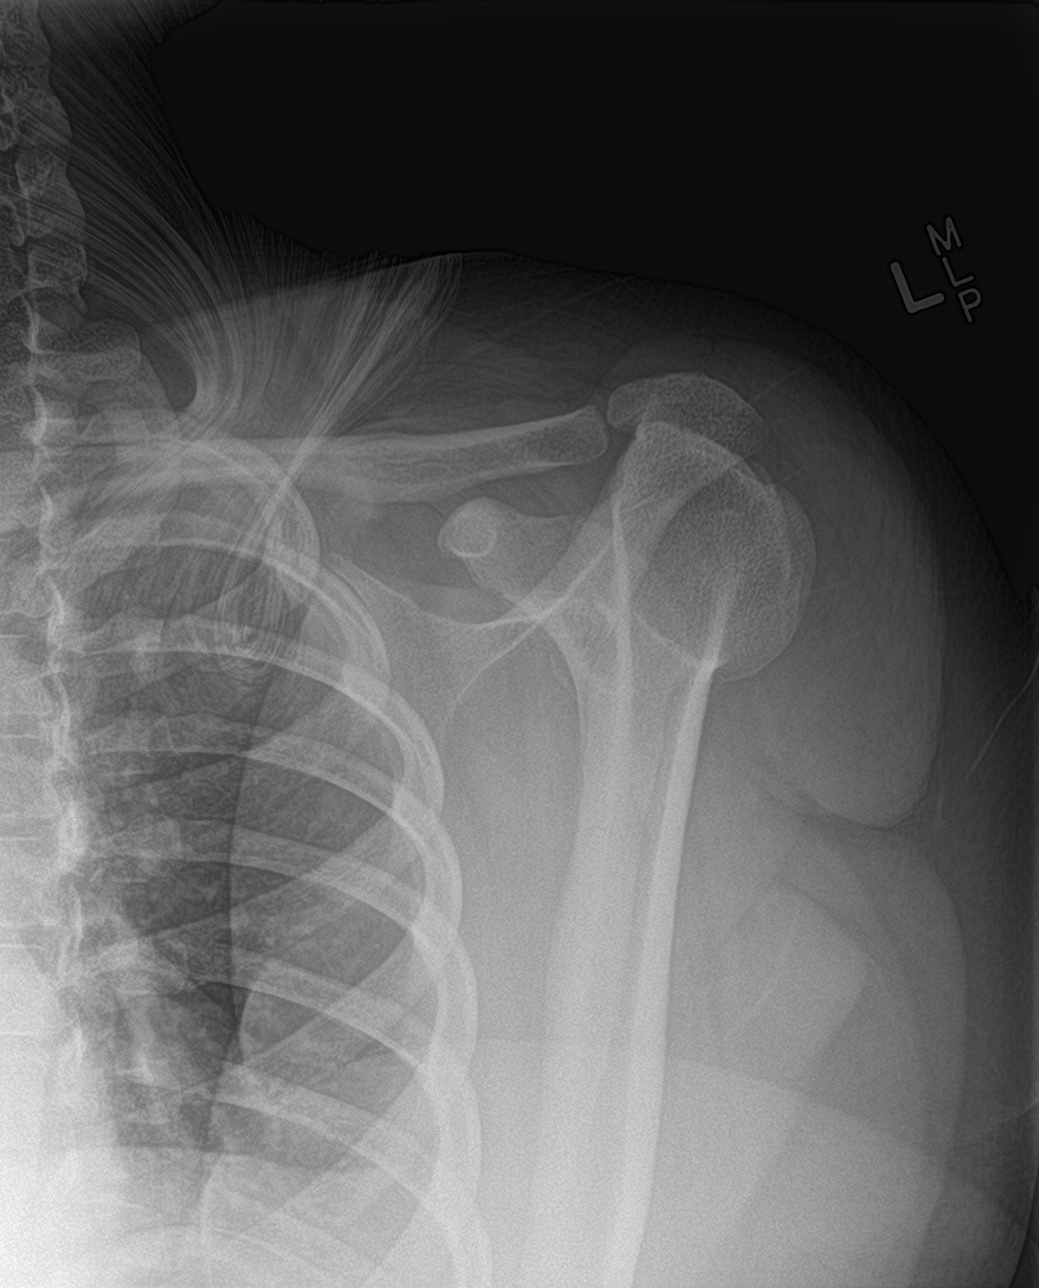

[shoulder axillary]
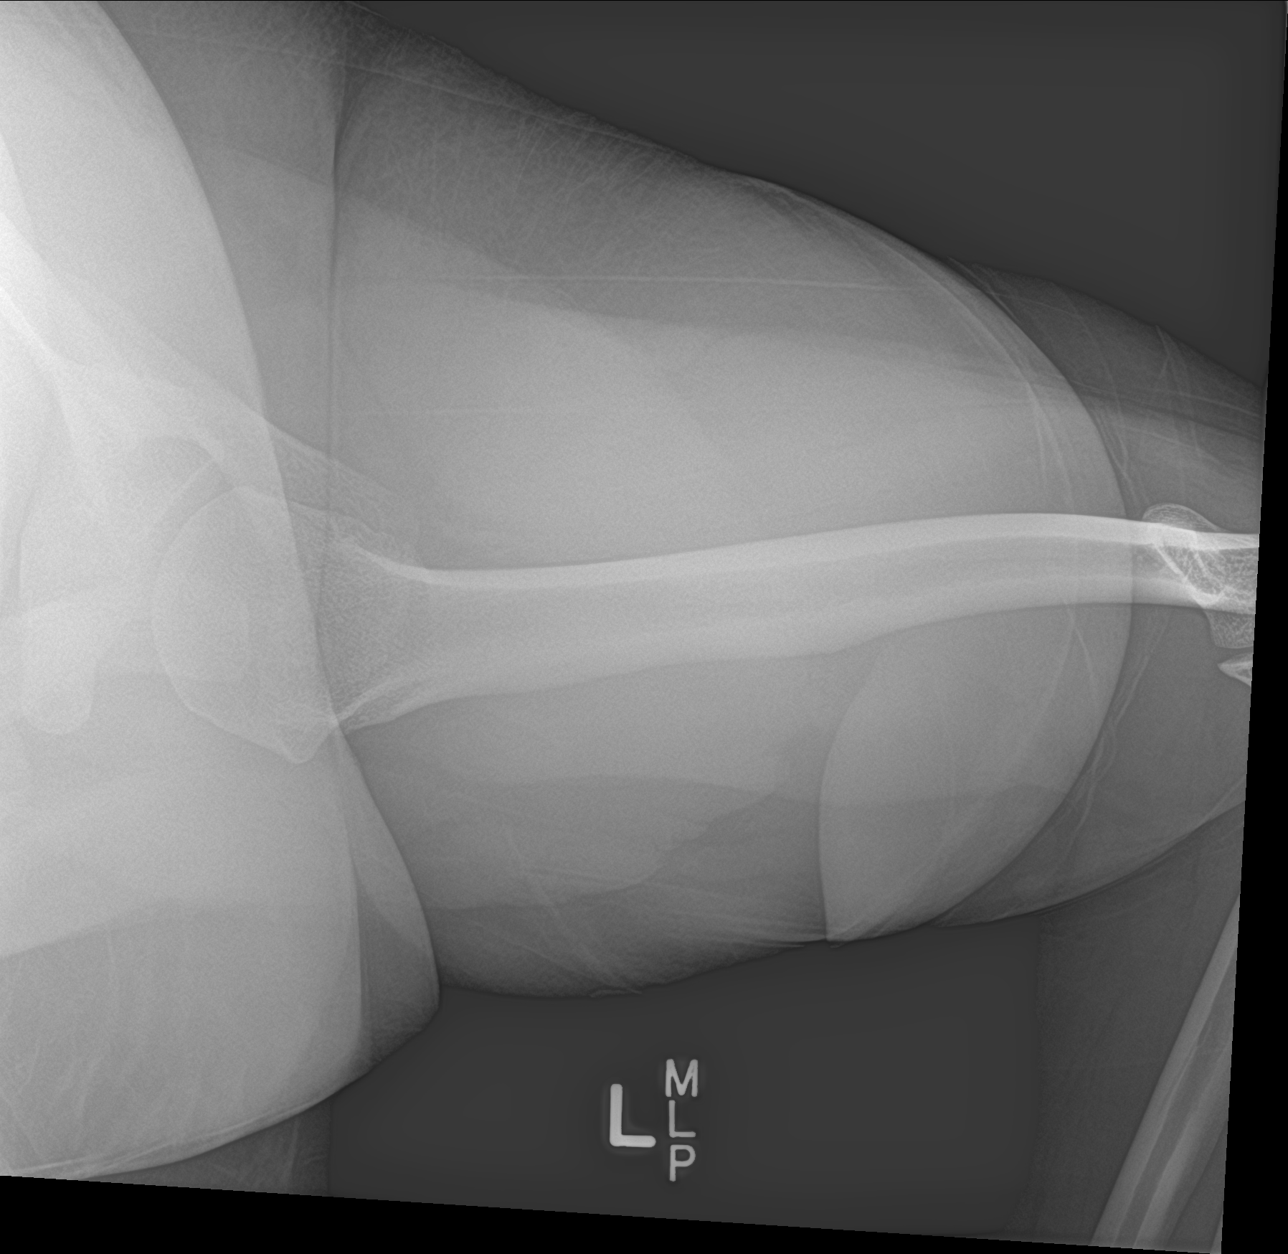

[shoulder y view (2 of 2)]
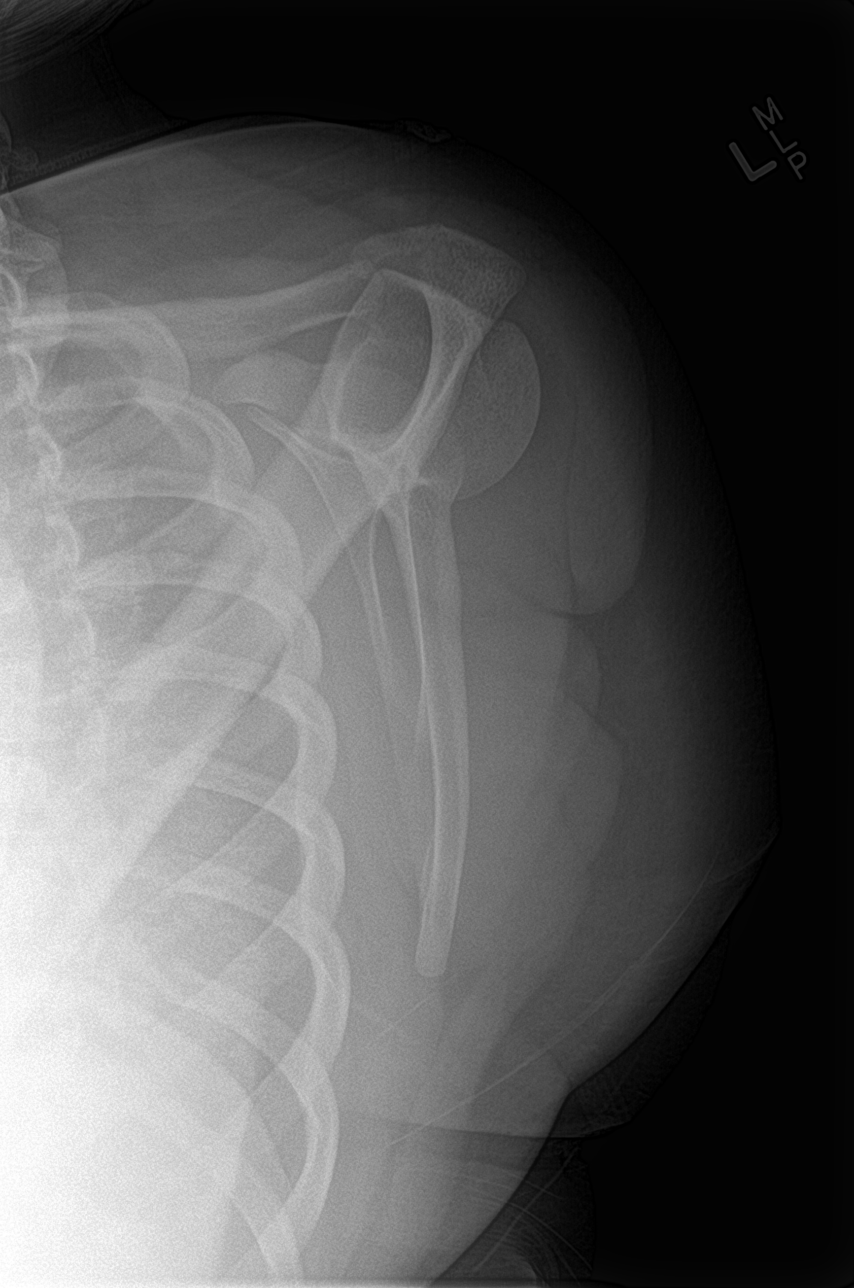

[4 of 4 positions shown; findings below may reference images not displayed]

FINDINGS: There is no evidence of fracture or dislocation. There is no
evidence of arthropathy or other focal bone abnormality. Soft
tissues are unremarkable.
IMPRESSION: Negative.
